# Patient Record
Sex: Female | Born: 1980 | Race: Black or African American | Hispanic: No | Marital: Married | Smoking: Never smoker
Health system: Southern US, Community
[De-identification: ages and names within clinical notes are randomized; demographics above are authoritative.]

## PROBLEM LIST (undated history)

## (undated) DIAGNOSIS — N979 Female infertility, unspecified: Secondary | ICD-10-CM

## (undated) DIAGNOSIS — D219 Benign neoplasm of connective and other soft tissue, unspecified: Secondary | ICD-10-CM

## (undated) DIAGNOSIS — Z5189 Encounter for other specified aftercare: Secondary | ICD-10-CM

## (undated) DIAGNOSIS — N809 Endometriosis, unspecified: Secondary | ICD-10-CM

## (undated) DIAGNOSIS — N800 Endometriosis of uterus: Secondary | ICD-10-CM

## (undated) DIAGNOSIS — N83209 Unspecified ovarian cyst, unspecified side: Secondary | ICD-10-CM

## (undated) DIAGNOSIS — Z90710 Acquired absence of both cervix and uterus: Secondary | ICD-10-CM

## (undated) DIAGNOSIS — N8003 Adenomyosis of the uterus: Secondary | ICD-10-CM

## (undated) HISTORY — DX: Unspecified ovarian cyst, unspecified side: N83.209

## (undated) HISTORY — PX: DILATION AND CURETTAGE OF UTERUS: SHX78

## (undated) HISTORY — DX: Endometriosis of uterus: N80.0

## (undated) HISTORY — DX: Acquired absence of both cervix and uterus: Z90.710

## (undated) HISTORY — DX: Adenomyosis of the uterus: N80.03

## (undated) HISTORY — DX: Encounter for other specified aftercare: Z51.89

## (undated) HISTORY — DX: Endometriosis, unspecified: N80.9

## (undated) HISTORY — DX: Female infertility, unspecified: N97.9

## (undated) HISTORY — PX: ABDOMINAL HYSTERECTOMY: SHX81

## (undated) HISTORY — DX: Benign neoplasm of connective and other soft tissue, unspecified: D21.9

---

## 2017-11-12 ENCOUNTER — Emergency Department (HOSPITAL_COMMUNITY)
Admission: EM | Admit: 2017-11-12 | Discharge: 2017-11-12 | Disposition: A | Discharge status: Home / Self Care | Payer: Self-pay | Attending: Emergency Medicine | Admitting: Emergency Medicine

## 2017-11-12 ENCOUNTER — Encounter (HOSPITAL_COMMUNITY): Payer: Self-pay | Admitting: Emergency Medicine

## 2017-11-12 ENCOUNTER — Other Ambulatory Visit: Payer: Self-pay

## 2017-11-12 DIAGNOSIS — M545 Low back pain, unspecified: Secondary | ICD-10-CM

## 2017-11-12 MED ORDER — NAPROXEN 500 MG PO TABS: 500.0000 mg | ORAL_TABLET | Freq: Two times a day (BID) | ORAL | 0 refills | Status: DC

## 2017-11-12 MED ORDER — CYCLOBENZAPRINE HCL 5 MG PO TABS: 5.0000 mg | ORAL_TABLET | Freq: Every evening | ORAL | 0 refills | Status: DC | PRN

## 2017-11-12 NOTE — Discharge Instructions (Signed)
Take naproxen 2 times a day with meals.  Do not take other anti-inflammatories at the same time open (Advil, Motrin, ibuprofen, Aleve). You may supplement with Tylenol if you need further pain control. Use heating pads to help with pain.  Use flexeril as needed at night for pain.  Try not to stay in a seated position for more than 20-30 minutes.  Follow-up with orthopedic doctor if your pain is not improving in 1 week. Return to the emergency room if you develop numbness, loss of bowel or bladder control, weakness, or any new or concerning symptoms.

## 2017-11-12 NOTE — ED Triage Notes (Signed)
C/o lower back pain x 2 weeks.  No known injury.  Denies pain, numbness, or weakness in legs.  Pt ambulatory to triage.

## 2017-11-12 NOTE — ED Provider Notes (Signed)
[facility] EMERGENCY DEPARTMENT Provider Note   CSN: [id number] Arrival date & time: 11/12/17  1820     History   Chief Complaint Chief Complaint  Patient presents with  . Back Pain    HPI Michelle Holloway is a 37 y.o. female presenting for evaluation of low back pain.  Patient states for the past 3 weeks, she has had low back pain.  This began gradually, and has gradually worsened.  She has tried massage, stretching, and time, but nothing has made her symptoms better.  She reports intermittent history of low back pain that has not been evaluated by a doctor in the past.  She denies fall, trauma, or injury.  She denies radiation of the pain down her legs.  No red flags of back pain including fever, trauma, numbness, tingling, loss of bowel or bladder control, history of cancer, or history of IV drug use.  She has not tried any Tylenol or ibuprofen.  Movement makes the pain worse, nothing makes it better.  She denies pain elsewhere.  She has no other medical problems, does not take medications daily.  HPI  History reviewed. No pertinent past medical history.  There are no active problems to display for this patient.   History reviewed. No pertinent surgical history.   OB History   None      Home Medications    Prior to Admission medications   Medication Sig Start Date End Date Taking? Authorizing Provider  cyclobenzaprine (FLEXERIL) 5 MG tablet Take 1 tablet (5 mg total) by mouth at bedtime as needed for muscle spasms. 11/12/17   Asalee Barrette, PA-C  naproxen (NAPROSYN) 500 MG tablet Take 1 tablet (500 mg total) by mouth 2 (two) times daily with a meal. 11/12/17   Cord Wilczynski, PA-C    Family History No family history on file.  Social History Social History   Tobacco Use  . Smoking status: Never Smoker  . Smokeless tobacco: Never Used  Substance Use Topics  . Alcohol use: Never    Frequency: Never  . Drug use: Never     Allergies     Patient has no allergy information on record.   Review of Systems Review of Systems  Constitutional: Negative for chills and fever.  Musculoskeletal: Positive for back pain.  Neurological: Negative for numbness.     Physical Exam Updated Vital Signs BP 128/70 (BP Location: Right Arm)   Pulse 80   Temp 98.6 F (37 C) (Oral)   Resp 16   Ht 5\' 7"  (1.702 m)   Wt 110.7 kg (244 lb)   LMP 08/30/2017 (Approximate)   SpO2 100%   BMI 38.22 kg/m   Physical Exam  Constitutional: She is oriented to person, place, and time. She appears well-developed and well-nourished. No distress.  HENT:  Head: Normocephalic and atraumatic.  Eyes: Pupils are equal, round, and reactive to light. Conjunctivae and EOM are normal.  Neck: Normal range of motion. Neck supple.  Cardiovascular: Normal rate, regular rhythm and intact distal pulses.  Pulmonary/Chest: Effort normal and breath sounds normal. No respiratory distress. She has no wheezes.  Abdominal: Soft. She exhibits no distension and no mass. There is no tenderness. There is no guarding.  Musculoskeletal: She exhibits tenderness.  Tenderness to palpation of lumbar and sacral back bilaterally.  No increased pain/focal pain over midline spine.  No obvious deformity.  Strength of lower extreme knees intact bilaterally.  Pedal pulses equal bilaterally.  Sensation intact bilaterally.  No saddle  paresthesias.  No tenderness palpation elsewhere on the back.  Neurological: She is alert and oriented to person, place, and time. She displays normal reflexes. No sensory deficit.  Skin: Skin is warm and dry.  Psychiatric: She has a normal mood and affect.  Nursing note and vitals reviewed.    ED Treatments / Results  Labs (all labs ordered are listed, but only abnormal results are displayed) Labs Reviewed - No data to display  EKG None  Radiology No results found.  Procedures Procedures (including critical care time)  Medications Ordered in  ED Medications - No data to display   Initial Impression / Assessment and Plan / ED Course  I have reviewed the triage vital signs and the nursing notes.  Pertinent labs & imaging results that were available during my care of the patient were reviewed by me and considered in my medical decision making (see chart for details).     Presenting for evaluation of low back pain.  Physical exam reassuring, no obvious neurologic deficits. No red flags for back pain. Likely MSK related pain, possible bulging disc.  However, at this time I do not believe further imaging is necessary.  Will treat with NSAIDs and muscle relaxers.  Discussed use of heat and positioning.  Follow-up with orthopedics as needed.  At this time, doubt vertebral injury, infection, spinal cord compression, myelopathy, or cauda equina syndrome.  Discussed findings with patient.  At this time, patient appears safe for discharge.  Return precautions given.  Patient states she understands and agrees to plan.   Final Clinical Impressions(s) / ED Diagnoses   Final diagnoses:  Acute bilateral low back pain without sciatica    ED Discharge Orders        Ordered    naproxen (NAPROSYN) 500 MG tablet  2 times daily with meals     11/12/17 2047    cyclobenzaprine (FLEXERIL) 5 MG tablet  At bedtime PRN     11/12/17 2047       Franchot Heidelberg, PA-C 11/12/17 2123    Fredia Sorrow, MD 11/16/17 [id number]

## 2017-11-12 NOTE — ED Notes (Signed)
Pt verbalizes understanding of d/c instructions. Pt received prescriptions. Pt ambulatory at d/c with all belongings.  

## 2019-05-23 HISTORY — PX: ABDOMINAL HYSTERECTOMY: SHX81

## 2019-09-10 ENCOUNTER — Other Ambulatory Visit: Payer: Self-pay

## 2020-02-27 ENCOUNTER — Encounter: Payer: Self-pay | Admitting: Nurse Practitioner

## 2020-02-27 ENCOUNTER — Other Ambulatory Visit: Payer: Self-pay

## 2020-02-27 ENCOUNTER — Ambulatory Visit (INDEPENDENT_AMBULATORY_CARE_PROVIDER_SITE_OTHER): Payer: 59 | Admitting: Nurse Practitioner

## 2020-02-27 VITALS — BP 118/64 | HR 75 | Temp 97.6°F | Resp 18 | Ht 66.5 in | Wt 257.2 lb

## 2020-02-27 DIAGNOSIS — Z8639 Personal history of other endocrine, nutritional and metabolic disease: Secondary | ICD-10-CM

## 2020-02-27 DIAGNOSIS — Z13220 Encounter for screening for lipoid disorders: Secondary | ICD-10-CM

## 2020-02-27 DIAGNOSIS — Z1159 Encounter for screening for other viral diseases: Secondary | ICD-10-CM

## 2020-02-27 DIAGNOSIS — Z0000 Encounter for general adult medical examination without abnormal findings: Secondary | ICD-10-CM

## 2020-02-27 DIAGNOSIS — Z1231 Encounter for screening mammogram for malignant neoplasm of breast: Secondary | ICD-10-CM | POA: Diagnosis not present

## 2020-02-27 DIAGNOSIS — I83813 Varicose veins of bilateral lower extremities with pain: Secondary | ICD-10-CM

## 2020-02-27 DIAGNOSIS — Z0001 Encounter for general adult medical examination with abnormal findings: Secondary | ICD-10-CM | POA: Diagnosis not present

## 2020-02-27 DIAGNOSIS — Z124 Encounter for screening for malignant neoplasm of cervix: Secondary | ICD-10-CM | POA: Diagnosis not present

## 2020-02-27 DIAGNOSIS — Z114 Encounter for screening for human immunodeficiency virus [HIV]: Secondary | ICD-10-CM

## 2020-02-27 DIAGNOSIS — Z7689 Persons encountering health services in other specified circumstances: Secondary | ICD-10-CM

## 2020-02-27 DIAGNOSIS — Z13228 Encounter for screening for other metabolic disorders: Secondary | ICD-10-CM

## 2020-02-27 NOTE — Patient Instructions (Addendum)
Follow a low fat, cholesterol, carbohydrate, sugar diet rich in fiber and 2 gram per day restricted sodium.  Get at least 20 minutes per day of exercise at least 4 times per week  Drink plenty of water  Labs today will call with results and direction if indicated  Blood pressure goal 140/90 or less  Vascular referral for bilateral lower extremity vascular symptoms

## 2020-02-27 NOTE — Progress Notes (Signed)
New Patient Office Visit  Subjective:  Patient ID: Michelle Holloway, female    DOB: 1981/08/11  Age: 39 y.o. MRN: [id number]  CC:  Chief Complaint  Patient presents with  . Establish Care    NP    HPI Michelle Holloway  Is a 39 year old female presenting for general adult exam and to establish care. She moves from [place] 3 years ago to [facility]. She had a complete hysterectomy in Oct 2020 r/t dysmenorrhea and has been doing well since. She has concerns with painful varicose veins in her legs that she would like a referral. She also would like to complete her mammogram and referral to GYN for annual .She would like to complete labs today to include Vit D with def h/o with no replacement and sxs of fatigue and occasional constipation.   Treatment plan and directions as types discussed and pt v/l understanding and desires.   Past Medical History:  Diagnosis Date  . H/O total hysterectomy     Past Surgical History:  Procedure Laterality Date  . ABDOMINAL HYSTERECTOMY      Family History  Problem Relation Age of Onset  . Diabetes Mother   . Hypertension Mother   . Hyperlipidemia Mother   . Rheum arthritis Mother   . Heart murmur Father   . Diabetes Father   . Hypertension Father   . Hyperlipidemia Father   . Diabetes Sister   . Hypertension Sister   . Rheum arthritis Sister   . Diabetes Brother   . Hypertension Brother   . Ovarian cancer Paternal Grandmother     Social History   Socioeconomic History  . Marital status: Married    Spouse name: Not on file  . Number of children: Not on file  . Years of education: Not on file  . Highest education level: Not on file  Occupational History  . Not on file  Tobacco Use  . Smoking status: Never Smoker  . Smokeless tobacco: Never Used  Vaping Use  . Vaping Use: Never used  Substance and Sexual Activity  . Alcohol use: Never  . Drug use: Never  . Sexual activity: Yes    Birth control/protection: Surgical  Other Topics Concern   . Not on file  Social History Narrative  . Not on file   Social Determinants of Health   Financial Resource Strain:   . Difficulty of Paying Living Expenses:   Food Insecurity:   . Worried About Charity fundraiser in the Last Year:   . Arboriculturist in the Last Year:   Transportation Needs:   . Film/video editor (Medical):   Marland Kitchen Lack of Transportation (Non-Medical):   Physical Activity:   . Days of Exercise per Week:   . Minutes of Exercise per Session:   Stress:   . Feeling of Stress :   Social Connections:   . Frequency of Communication with Friends and Family:   . Frequency of Social Gatherings with Friends and Family:   . Attends Religious Services:   . Active Member of Clubs or Organizations:   . Attends Archivist Meetings:   Marland Kitchen Marital Status:   Intimate Partner Violence:   . Fear of Current or Ex-Partner:   . Emotionally Abused:   Marland Kitchen Physically Abused:   . Sexually Abused:     ROS Review of Systems  All other systems reviewed and are negative.   Objective:   Today's Vitals: BP 118/64 (BP Location: Left  Arm, Patient Position: Sitting, Cuff Size: Normal)   Pulse 75   Temp 97.6 F (36.4 C) (Temporal)   Resp 18   Ht 5' 6.5" (1.689 m)   Wt 257 lb 3.2 oz (116.7 kg)   LMP 08/30/2017 (Approximate)   SpO2 97%   BMI 40.89 kg/m   Physical Exam Vitals and nursing note reviewed.  Constitutional:      General: She is not in acute distress.    Appearance: Normal appearance. She is well-developed and well-groomed. She is not ill-appearing, toxic-appearing or diaphoretic.  HENT:     Head: Normocephalic.     Jaw: There is normal jaw occlusion.     Right Ear: Hearing, ear canal and external ear normal.     Left Ear: Hearing, ear canal and external ear normal.     Nose: Nose normal. No nasal deformity, septal deviation, mucosal edema, congestion or rhinorrhea.     Mouth/Throat:     Lips: Pink.     Mouth: Mucous membranes are moist.     Dentition:  Normal dentition. No dental tenderness.     Tongue: No lesions. Tongue does not deviate from midline.     Pharynx: Oropharynx is clear. Uvula midline. No oropharyngeal exudate or posterior oropharyngeal erythema.  Eyes:     General: Lids are normal. Lids are everted, no foreign bodies appreciated. No scleral icterus.    Extraocular Movements: Extraocular movements intact.     Conjunctiva/sclera: Conjunctivae normal.     Pupils: Pupils are equal, round, and reactive to light.  Neck:     Thyroid: No thyromegaly or thyroid tenderness.     Vascular: No carotid bruit or JVD.  Cardiovascular:     Rate and Rhythm: Normal rate and regular rhythm.     Pulses: Normal pulses.     Heart sounds: Normal heart sounds, S1 normal and S2 normal.  Pulmonary:     Effort: Pulmonary effort is normal. No accessory muscle usage.     Breath sounds: Normal breath sounds.  Abdominal:     General: Abdomen is flat. Bowel sounds are normal. There is no distension or abdominal bruit.     Palpations: Abdomen is soft. There is no hepatomegaly or splenomegaly.     Tenderness: There is no abdominal tenderness. There is no right CVA tenderness, left CVA tenderness, guarding or rebound. Negative signs include Murphy's sign and McBurney's sign.  Musculoskeletal:        General: No swelling. Normal range of motion.     Right shoulder: Normal.     Left shoulder: Normal.     Cervical back: Normal, full passive range of motion without pain, normal range of motion and neck supple.     Thoracic back: Normal.     Lumbar back: Normal.     Right lower leg: No edema.     Left lower leg: No edema.  Lymphadenopathy:     Head:     Right side of head: No submental, submandibular or tonsillar adenopathy.     Left side of head: No submental, submandibular or tonsillar adenopathy.     Cervical: No cervical adenopathy.  Skin:    General: Skin is warm and dry.     Capillary Refill: Capillary refill takes less than 2 seconds.      Coloration: Skin is not jaundiced.     Findings: No bruising, erythema or rash.  Neurological:     General: No focal deficit present.     Mental Status: She is alert  and oriented to person, place, and time.     GCS: GCS eye subscore is 4. GCS verbal subscore is 5. GCS motor subscore is 6.     Gait: Gait normal.  Psychiatric:        Attention and Perception: Attention and perception normal.        Mood and Affect: Mood and affect normal.        Speech: Speech normal.        Behavior: Behavior normal. Behavior is cooperative.        Thought Content: Thought content normal.        Cognition and Memory: Cognition and memory normal.        Judgment: Judgment normal.     Assessment & Plan:   Problem List Items Addressed This Visit    None    Visit Diagnoses    Adult general medical exam    -  Primary   Relevant Orders   Lipid panel   CBC with Differential/Platelet   COMPLETE METABOLIC PANEL WITH GFR   Encounter to establish care       Relevant Orders   Lipid panel   CBC with Differential/Platelet   COMPLETE METABOLIC PANEL WITH GFR   Breast cancer screening by mammogram       Relevant Orders   MM Digital Screening   Encounter for screening for cervical cancer        Relevant Orders   Ambulatory referral to Gynecology   Screening, lipid       Relevant Orders   Lipid panel   Screening for metabolic disorder       Relevant Orders   CBC with Differential/Platelet   COMPLETE METABOLIC PANEL WITH GFR   H/O vitamin D deficiency       Relevant Orders   VITAMIN D 25 Hydroxy (Vit-D Deficiency, Fractures)   Need for hepatitis C screening test       Relevant Orders   Hepatitis C antibody   Encounter for screening for HIV       Relevant Orders   HIV Antibody (routine testing w rflx)   Varicose veins of both lower extremities with pain       Relevant Orders   AMB Referral to Peripheral Vascular Navigator    Follow a low fat, cholesterol, carbohydrate, sugar diet rich in fiber  and 2 gram per day restricted sodium.  Get at least 20 minutes per day of exercise at least 4 times per week  Drink plenty of water  Labs today will call with results and direction if indicated  Blood pressure goal 140/90 or less  Vascular referral for bilateral lower extremity vascular symptoms  Outpatient Encounter Medications as of 02/27/2020  Medication Sig  . [DISCONTINUED] naproxen (NAPROSYN) 500 MG tablet Take 1 tablet (500 mg total) by mouth 2 (two) times daily with a meal.  . [DISCONTINUED] cyclobenzaprine (FLEXERIL) 5 MG tablet Take 1 tablet (5 mg total) by mouth at bedtime as needed for muscle spasms.   No facility-administered encounter medications on file as of 02/27/2020.    Follow-up: Return in about 1 year (around 02/26/2021).   Annie Main, FNP

## 2020-02-28 ENCOUNTER — Other Ambulatory Visit (HOSPITAL_COMMUNITY): Payer: Self-pay

## 2020-03-02 ENCOUNTER — Telehealth (HOSPITAL_COMMUNITY): Payer: Self-pay

## 2020-03-02 ENCOUNTER — Other Ambulatory Visit: Payer: Self-pay | Admitting: Nurse Practitioner

## 2020-03-02 DIAGNOSIS — R7309 Other abnormal glucose: Secondary | ICD-10-CM

## 2020-03-02 DIAGNOSIS — E785 Hyperlipidemia, unspecified: Secondary | ICD-10-CM

## 2020-03-02 DIAGNOSIS — Z8639 Personal history of other endocrine, nutritional and metabolic disease: Secondary | ICD-10-CM

## 2020-03-02 MED ORDER — VITAMIN D-3 25 MCG (1000 UT) PO CAPS: 1.0000 | ORAL_CAPSULE | ORAL | 11 refills | Status: DC

## 2020-03-02 MED ORDER — ROSUVASTATIN CALCIUM 5 MG PO TABS: 5.0000 mg | ORAL_TABLET | Freq: Every day | ORAL | 3 refills | Status: DC

## 2020-03-02 MED ORDER — VITAMIN D3 1.25 MG (50000 UT) PO CAPS: 1.0000 | ORAL_CAPSULE | ORAL | 0 refills | Status: DC

## 2020-03-02 NOTE — Progress Notes (Signed)
Established care, no medication , no medical history  Labs show elevated triglycerides, elevated LDL. Vitamin D is low, Glucose is high, Hep C and HIV non reactive/negative.  Add A1C to lab please!  I will go ahead and add Rosuvastatin and vitamin D. The pt will need to have labs in 3 months for Lipids, A1C, urine microalbuminuria, Vitamin D.   Pt should follow a low fat/cholesterol, sugar/carbohydrate diet, get at least 20 minute of exercise at least 4 times per week. Once I have the A1C we will let her know status.

## 2020-03-02 NOTE — Telephone Encounter (Signed)
PV Navigator Consult acknowledged and chart reviewed. I was able to speak with patient via telephone to introduce myself and my role.   Patient recently moved to the [place] area and is getting established with a primary care physician ([facility]). Consult received for Varicose veins BLE with pain ([phone])  Per patient, she has noticed an increase in varicose veins BLE with left more so than right leg. She describes pain in left lower leg with ambulation however denies pain in legs at rest unless she "pushes" on her legs. Denies any wounds. States she does have swelling in both feet and ankles that will go down when she elevates extremities. She says she would like a vascular consult to help her determine what can be done to limit pain and swelling.   Patient denies history of DM, CAD, Renal disease. Never smoked.  I will reach out to [facility] for referral and appointment to determine screening needs.   Patient denies other barriers/questions at this time. Contact information given to patient should needs arise.   Thank you, Cletis Media RN BSN [facility] [place]

## 2020-03-03 ENCOUNTER — Other Ambulatory Visit: Payer: Self-pay

## 2020-03-03 ENCOUNTER — Ambulatory Visit
Admission: RE | Admit: 2020-03-03 | Discharge: 2020-03-03 | Disposition: A | Discharge status: Home / Self Care | Payer: 59 | Source: Ambulatory Visit | Attending: Nurse Practitioner | Admitting: Nurse Practitioner

## 2020-03-03 DIAGNOSIS — Z1231 Encounter for screening mammogram for malignant neoplasm of breast: Secondary | ICD-10-CM

## 2020-03-04 LAB — LIPID PANEL
Cholesterol: 212 mg/dL — ABNORMAL HIGH (ref <200)
HDL: 64 mg/dL (ref >=50)
LDL Cholesterol (Calc): 121 mg/dL (calc) — ABNORMAL HIGH
Non-HDL Cholesterol (Calc): 148 mg/dL (calc) — ABNORMAL HIGH (ref <130)
Total CHOL/HDL Ratio: 3.3 (calc) (ref <5.0)
Triglycerides: 159 mg/dL — ABNORMAL HIGH (ref <150)

## 2020-03-04 LAB — CBC WITH DIFFERENTIAL/PLATELET
Absolute Monocytes: 510 cells/uL (ref 200–950)
Basophils Absolute: 30 cells/uL (ref 0–200)
Basophils Relative: 0.5 %
Eosinophils Absolute: 342 cells/uL (ref 15–500)
Eosinophils Relative: 5.7 %
HCT: 45.3 % — ABNORMAL HIGH (ref 35.0–45.0)
Hemoglobin: 14.5 g/dL (ref 11.7–15.5)
Lymphs Abs: 3570 cells/uL (ref 850–3900)
MCH: 27.5 pg (ref 27.0–33.0)
MCHC: 32 g/dL (ref 32.0–36.0)
MCV: 86 fL (ref 80.0–100.0)
MPV: 9.8 fL (ref 7.5–12.5)
Monocytes Relative: 8.5 %
Neutro Abs: 1548 cells/uL (ref 1500–7800)
Neutrophils Relative %: 25.8 %
Platelets: 246 10*3/uL (ref 140–400)
RBC: 5.27 10*6/uL — ABNORMAL HIGH (ref 3.80–5.10)
RDW: 14.1 % (ref 11.0–15.0)
Total Lymphocyte: 59.5 %
WBC: 6 10*3/uL (ref 3.8–10.8)

## 2020-03-04 LAB — COMPLETE METABOLIC PANEL WITH GFR
AG Ratio: 1.3 (calc) (ref 1.0–2.5)
ALT: 19 U/L (ref 6–29)
AST: 17 U/L (ref 10–30)
Albumin: 4.1 g/dL (ref 3.6–5.1)
Alkaline phosphatase (APISO): 83 U/L (ref 31–125)
BUN: 10 mg/dL (ref 7–25)
CO2: 25 mmol/L (ref 20–32)
Calcium: 9.4 mg/dL (ref 8.6–10.2)
Chloride: 102 mmol/L (ref 98–110)
Creat: 0.88 mg/dL (ref 0.50–1.10)
GFR, Est African American: 95 mL/min/{1.73_m2} (ref >=60)
GFR, Est Non African American: 82 mL/min/{1.73_m2} (ref >=60)
Globulin: 3.1 g/dL (calc) (ref 1.9–3.7)
Glucose, Bld: 141 mg/dL — ABNORMAL HIGH (ref 65–99)
Potassium: 4.5 mmol/L (ref 3.5–5.3)
Sodium: 137 mmol/L (ref 135–146)
Total Bilirubin: 0.4 mg/dL (ref 0.2–1.2)
Total Protein: 7.2 g/dL (ref 6.1–8.1)

## 2020-03-04 LAB — HIV ANTIBODY (ROUTINE TESTING W REFLEX): HIV 1&2 Ab, 4th Generation: NONREACTIVE

## 2020-03-04 LAB — HEMOGLOBIN A1C W/OUT EAG: Hgb A1c MFr Bld: 6.1 % of total Hgb — ABNORMAL HIGH (ref <5.7)

## 2020-03-04 LAB — TEST AUTHORIZATION

## 2020-03-04 LAB — VITAMIN D 25 HYDROXY (VIT D DEFICIENCY, FRACTURES): Vit D, 25-Hydroxy: 13 ng/mL — ABNORMAL LOW (ref 30–100)

## 2020-03-04 LAB — HEPATITIS C ANTIBODY
Hepatitis C Ab: NONREACTIVE
SIGNAL TO CUT-OFF: 0.07 (ref <1.00)

## 2020-04-07 ENCOUNTER — Ambulatory Visit: Payer: 59 | Admitting: Cardiovascular Disease

## 2020-04-22 ENCOUNTER — Encounter: Payer: Self-pay | Admitting: General Practice

## 2020-04-24 ENCOUNTER — Other Ambulatory Visit: Payer: Self-pay

## 2020-04-24 DIAGNOSIS — I8390 Asymptomatic varicose veins of unspecified lower extremity: Secondary | ICD-10-CM

## 2020-05-07 ENCOUNTER — Ambulatory Visit (INDEPENDENT_AMBULATORY_CARE_PROVIDER_SITE_OTHER): Payer: 59 | Admitting: Physician Assistant

## 2020-05-07 ENCOUNTER — Other Ambulatory Visit: Payer: Self-pay

## 2020-05-07 ENCOUNTER — Ambulatory Visit (HOSPITAL_COMMUNITY)
Admission: RE | Admit: 2020-05-07 | Discharge: 2020-05-07 | Disposition: A | Discharge status: Home / Self Care | Payer: 59 | Source: Ambulatory Visit | Attending: Vascular Surgery | Admitting: Vascular Surgery

## 2020-05-07 VITALS — BP 133/91 | HR 79 | Resp 16 | Ht 67.0 in | Wt 243.0 lb

## 2020-05-07 DIAGNOSIS — I8390 Asymptomatic varicose veins of unspecified lower extremity: Secondary | ICD-10-CM | POA: Diagnosis present

## 2020-05-07 DIAGNOSIS — I8393 Asymptomatic varicose veins of bilateral lower extremities: Secondary | ICD-10-CM

## 2020-05-07 DIAGNOSIS — M7989 Other specified soft tissue disorders: Secondary | ICD-10-CM

## 2020-05-07 NOTE — Progress Notes (Signed)
[facility]           OF [place]  History and Physical   Michelle Holloway is a 39 y.o. female who presents with leg swelling.  She states that she has had some leg swelling for a while and it was not really bothersome until recently.  She states that she has a knot on the anterior portion of her thigh just above her knee that has been present for about 3 years.  She states the size of it hasn't really changed, but recently, it has become tender to touch.  There is no redness present.  She states that if she sits for long periods of time or has her knees bent, she starts to have some numbness in her legs and in the crease behind her knee.  She states she also has this happen with her arms bilaterally.  She does not have hx of DVT.  She has tried compression in the past and this did help with her swelling.  She does not have family hx of varicose veins.  She states that she recently became vegan and has lost 15lbs.  She has done well with this and enjoyed trying new vegan restaurants downtown.    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   The pt is on a statin for cholesterol management.  The pt is not on a daily aspirin.   Other AC:  none The pt is not on medications for hypertension.   The pt is not diabetic.   Tobacco hx:  never  There is no family hx of AAA.  Past Medical History:  Diagnosis Date  . H/O total hysterectomy     Past Surgical History:  Procedure Laterality Date  . ABDOMINAL HYSTERECTOMY      Social History   Socioeconomic History  . Marital status: Married    Spouse name: Not on file  . Number of children: Not on file  . Years of education: Not on file  . Highest education level: Not on file  Occupational History  . Not on file  Tobacco Use  . Smoking status: Never Smoker  . Smokeless tobacco: Never Used  Vaping Use  . Vaping Use: Never  used  Substance and Sexual Activity  . Alcohol use: Never  . Drug use: Never  . Sexual activity: Yes    Birth control/protection: Surgical  Other Topics Concern  . Not on file  Social History Narrative  . Not on file   Social Determinants of Health   Financial Resource Strain:   . Difficulty of Paying Living Expenses: Not on file  Food Insecurity:   . Worried About Charity fundraiser in the Last Year: Not on file  . Ran Out of Food in the Last Year: Not on file  Transportation Needs:   . Lack of Transportation (Medical): Not on file  . Lack of Transportation (Non-Medical): Not on file  Physical Activity:   . Days of Exercise per Week: Not on file  . Minutes of Exercise per Session: Not on file  Stress:   . Feeling of Stress : Not on file  Social Connections:   . Frequency of Communication with Friends and Family: Not on file  . Frequency of Social Gatherings with Friends and Family: Not on file  . Attends Religious Services: Not on file  .  Active Member of Clubs or Organizations: Not on file  . Attends Archivist Meetings: Not on file  . Marital Status: Not on file  Intimate Partner Violence:   . Fear of Current or Ex-Partner: Not on file  . Emotionally Abused: Not on file  . Physically Abused: Not on file  . Sexually Abused: Not on file     Family History  Problem Relation Age of Onset  . Diabetes Mother   . Hypertension Mother   . Hyperlipidemia Mother   . Rheum arthritis Mother   . Heart murmur Father   . Diabetes Father   . Hypertension Father   . Hyperlipidemia Father   . Diabetes Sister   . Hypertension Sister   . Rheum arthritis Sister   . Diabetes Brother   . Hypertension Brother   . Ovarian cancer Paternal Grandmother     Current Outpatient Medications  Medication Sig Dispense Refill  . Cholecalciferol (VITAMIN D-3) 25 MCG (1000 UT) CAPS Take 1 capsule (1,000 Units total) by mouth 1 day or 1 dose for 360 doses. TO BE STARTED ONCE  COMPLETED WEEKLY DOSE OF VITAMIN D 3 50,000 UNITS FOR 8 WEEKS. 30 capsule 11  . Cholecalciferol (VITAMIN D3) 1.25 MG (50000 UT) CAPS Take 1 capsule by mouth once a week. ONCE COMPLETED START TAKING THE 1,000 MG DAILY DOSE 8 capsule 0  . rosuvastatin (CRESTOR) 5 MG tablet Take 1 tablet (5 mg total) by mouth daily. 90 tablet 3   No current facility-administered medications for this visit.    No Known Allergies  REVIEW OF SYSTEMS:   [X]  denotes positive finding, [ ]  denotes negative finding Cardiac  Comments:  Chest pain or chest pressure:    Shortness of breath upon exertion:    Short of breath when lying flat:    Irregular heart rhythm:        Vascular    Pain in calf, thigh, or hip brought on by ambulation:    Pain in feet at night that wakes you up from your sleep:     Blood clot in your veins:    Leg swelling:  x See HPI      Pulmonary    Oxygen at home:    Productive cough:     Wheezing:         Neurologic    Sudden weakness in arms or legs:     Sudden numbness in arms or legs:  x See HPI  Sudden onset of difficulty speaking or slurred speech:    Temporary loss of vision in one eye:  x See HPI  Problems with dizziness:         Gastrointestinal    Blood in stool:     Vomited blood:         Genitourinary    Burning when urinating:     Blood in urine:        Psychiatric    Major depression:         Hematologic    Bleeding problems:    Problems with blood clotting too easily:        Skin    Rashes or ulcers:        Constitutional    Fever or chills:      PHYSICAL EXAMINATION:  Today's Vitals   05/07/20 1518  BP: (!) 133/91  Pulse: 79  Resp: 16  SpO2: 97%  Weight: 243 lb (110.2 kg)  Height: 5\' 7"  (1.702 m)  PainSc:  5    Body mass index is 38.06 kg/m.   General:  WDWN in NAD; vital signs documented above Gait: Normal HENT: WNL, normocephalic Pulmonary: normal non-labored breathing without wheezing Cardiac: regular HR; without carotid  bruits Abdomen: soft, NT, no masses; aortic pulse is not palpable Skin: without rashes Vascular Exam/Pulses:  Right Left  Radial 2+ (normal) 2+ (normal)  DP 2+ (normal) 2+ (normal)  PT Unable to palpate Unable to palpate   Extremities: without ischemic changes, without cellulitis; without open wounds; without skin color changes; there is a tiny palpable mass smaller than a BB in the left distal anterior thigh and just medial to midline.  There is no redness present.  Musculoskeletal: no muscle wasting or atrophy  Neurologic: A&O X 3;  moving all extremities equally Psychiatric:  The pt has Normal affect.   Non-Invasive Vascular Imaging:   Venous duplex on 05/07/2020: +--------------+---------+------+-----------+------------+--------+  LEFT     Reflux NoRefluxReflux TimeDiameter cmsComments               Yes                   +--------------+---------+------+-----------+------------+--------+  CFV            yes  >1 second             +--------------+---------+------+-----------+------------+--------+  FV mid    no                         +--------------+---------+------+-----------+------------+--------+  Popliteal   no                         +--------------+---------+------+-----------+------------+--------+  GSV at SFJ        yes  >500 ms   0.68        +--------------+---------+------+-----------+------------+--------+  GSV prox thigh      yes  >500 ms   0.48        +--------------+---------+------+-----------+------------+--------+  GSV mid thigh       yes  >500 ms   0.4         +--------------+---------+------+-----------+------------+--------+  GSV dist thighno               0.27        +--------------+---------+------+-----------+------------+--------+   GSV at knee        yes  >500 ms   0.24        +--------------+---------+------+-----------+------------+--------+  GSV prox calf no               0.3         +--------------+---------+------+-----------+------------+--------+  SSV Pop Fossa no               0.13        +--------------+---------+------+-----------+------------+--------+  SSV prox calf no               0.17        +--------------+---------+------+-----------+------------+--------+  SSV mid calf no               0.11        +--------------+---------+------+-----------+------------+--------+   Left distal thigh Medioanterior- palpable knot, heterogenous area  measuring 0.4 cm with no vascularity.    Summary:  Left:  - No evidence of deep vein thrombosis seen in the left lower extremity,  from the common femoral through the popliteal veins.  - No evidence of superficial venous thrombosis in the left lower  extremity.  - Venous reflux is noted  in the left common femoral vein.  - Venous reflux is noted in the left sapheno-femoral junction.  - Venous reflux is noted in the left greater saphenous vein in the thigh.    Michelle Holloway is a 39 y.o. female who presents with: bilateral leg swelling with left worse than right  The left leg has had numbness and swelling.  She does have evidence of deep venous reflux as well as superficial venous reflux on the left leg.  The GSV diameter is not adequate for laser ablation.   -discussed with pt about wearing thigh high 20-59mmHg compression stockings and elevating legs to help with swelling.  Hand out was given. Discussed water exercises as well. -discussed importance of weight loss.  She recently started vegan diet and has lost 15lbs.  She has done well with this.   -she does have a tiny nodule on the distal left thigh that has been present for about 3  years and the size has not changed and has become more tender in the recent past.  On u/s, there was no vascularity to this.  Advised pt to continue to monitor and if it enlarges or becomes more bothersome, to discuss with her PCP for referral to possibly dermatology or surgery for possible removal.   Also advised her to try warm compresses to see if she gets any relief with this. There is no redness or evidence of infection present. -pt will f/u as needed.    Leontine Locket, [facility] 05/07/2020 2:45 PM  Clinic MD:  Carlis Abbott on call MD

## 2020-07-23 ENCOUNTER — Ambulatory Visit: Payer: 59 | Admitting: Obstetrics and Gynecology

## 2020-08-31 ENCOUNTER — Other Ambulatory Visit: Payer: Self-pay

## 2020-08-31 ENCOUNTER — Ambulatory Visit (INDEPENDENT_AMBULATORY_CARE_PROVIDER_SITE_OTHER): Payer: 59 | Admitting: Nurse Practitioner

## 2020-08-31 ENCOUNTER — Encounter: Payer: Self-pay | Admitting: Nurse Practitioner

## 2020-08-31 VITALS — BP 120/82 | HR 89 | Temp 97.7°F | Ht 67.0 in | Wt 238.0 lb

## 2020-08-31 DIAGNOSIS — Z13220 Encounter for screening for lipoid disorders: Secondary | ICD-10-CM

## 2020-08-31 DIAGNOSIS — Z13228 Encounter for screening for other metabolic disorders: Secondary | ICD-10-CM | POA: Diagnosis not present

## 2020-08-31 DIAGNOSIS — E785 Hyperlipidemia, unspecified: Secondary | ICD-10-CM

## 2020-08-31 DIAGNOSIS — R1032 Left lower quadrant pain: Secondary | ICD-10-CM | POA: Insufficient documentation

## 2020-08-31 DIAGNOSIS — Z1329 Encounter for screening for other suspected endocrine disorder: Secondary | ICD-10-CM

## 2020-08-31 DIAGNOSIS — R7309 Other abnormal glucose: Secondary | ICD-10-CM | POA: Diagnosis not present

## 2020-08-31 DIAGNOSIS — M79675 Pain in left toe(s): Secondary | ICD-10-CM

## 2020-08-31 DIAGNOSIS — N644 Mastodynia: Secondary | ICD-10-CM | POA: Insufficient documentation

## 2020-08-31 HISTORY — DX: Left lower quadrant pain: R10.32

## 2020-08-31 HISTORY — DX: Pain in left toe(s): M79.675

## 2020-08-31 NOTE — Assessment & Plan Note (Signed)
Likely due to ingrown toenail.  Discussed foot care.  No s/s infection today.  May consider removal in future if pain remains an issue.
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
## 2020-08-31 NOTE — Assessment & Plan Note (Signed)
Acute, improving.  Examination today normal.  Recommend ice/Tylenol for pain.  Bruise appears to be resolving.  Will recheck in ~2 weeks at physical.  Encouraged to repeat mammogram as scheduled.

## 2020-08-31 NOTE — Assessment & Plan Note (Addendum)
Acute, ongoing.  Unclear etiology, no red flags.  Unable to obtain urine testing or labs today due to staffing in laboratory; labs ordered for future visit.  Suspect kidney stone; will check urine at upcoming physical appointment.  Encouraged to keep upcoming appointment in March with OB/GYN for pap smear.

## 2020-08-31 NOTE — Patient Instructions (Signed)
Ingrown Toenail An ingrown toenail occurs when the corner or sides of a toenail grow into the surrounding skin. This causes discomfort and pain. The big toe is most commonly affected, but any of the toes can be affected. If an ingrown toenail is nottreated, it can become infected. What are the causes? This condition may be caused by: Wearing shoes that are too small or tight. An injury, such as stubbing your toe or having your toe stepped on. Improper cutting or care of your toenails. Having nail or foot abnormalities that were present from birth (congenital abnormalities), such as having a nail that is too big for your toe. What increases the risk? The following factors may make you more likely to develop ingrown toenails: Age. Nails tend to get thicker with age, so ingrown nails are more common among older people. Cutting your toenails incorrectly, such as cutting them very short or cutting them unevenly. An ingrown toenail is more likely to get infected if you have: Diabetes. Blood flow (circulation) problems. What are the signs or symptoms? Symptoms of an ingrown toenail may include: Pain, soreness, or tenderness. Redness. Swelling. Hardening of the skin that surrounds the toenail. Signs that an ingrown toenail may be infected include: Fluid or pus. Symptoms that get worse instead of better. How is this diagnosed? An ingrown toenail may be diagnosed based on your medical history, your symptoms, and a physical exam. If you have fluid or blood coming from your toenail, a sample may be collected to test for the specific type of bacteriathat is causing the infection. How is this treated? Treatment depends on how severe your ingrown toenail is. You may be able to care for your toenail at home. If you have an infection, you may be prescribed antibiotic medicines. If you have fluid or pus draining from your toenail, your health care provider may drain it. If you have trouble walking, you  may be given crutches to use. If you have a severe or infected ingrown toenail, you may need a procedure to remove part or all of the nail. Follow these instructions at home: Foot care  Do not pick at your toenail or try to remove it yourself. Soak your foot in warm, soapy water. Do this for 20 minutes, 3 times a day, or as often as told by your health care provider. This helps to keep your toe clean and keep your skin soft. Wear shoes that fit well and are not too tight. Your health care provider may recommend that you wear open-toed shoes while you heal. Trim your toenails regularly and carefully. Cut your toenails straight across to prevent injury to the skin at the corners of the toenail. Do not cut your nails in a curved shape. Keep your feet clean and dry to help prevent infection.  Medicines Take over-the-counter and prescription medicines only as told by your health care provider. If you were prescribed an antibiotic, take it as told by your health care provider. Do not stop taking the antibiotic even if you start to feel better. Activity Return to your normal activities as told by your health care provider. Ask your health care provider what activities are safe for you. Avoid activities that cause pain. General instructions If your health care provider told you to use crutches to help you move around, use them as instructed. Keep all follow-up visits as told by your health care provider. This is important. Contact a health care provider if: You have more redness, swelling, pain, or   other symptoms that do not improve with treatment. You have fluid, blood, or pus coming from your toenail. Get help right away if: You have a red streak on your skin that starts at your foot and spreads up your leg. You have a fever. Summary An ingrown toenail occurs when the corner or sides of a toenail grow into the surrounding skin. This causes discomfort and pain. The big toe is most commonly  affected, but any of the toes can be affected. If an ingrown toenail is not treated, it can become infected. Fluid or pus draining from your toenail is a sign of infection. Your health care provider may need to drain it. You may be given antibiotics to treat the infection. Trimming your toenails regularly and properly can help you prevent an ingrown toenail. This information is not intended to replace advice given to you by your health care provider. Make sure you discuss any questions you have with your healthcare provider. Document Revised: 11/30/2018 Document Reviewed: 04/26/2017 Elsevier Patient Education  2021 [employer]  

## 2020-09-09 ENCOUNTER — Other Ambulatory Visit: Payer: Self-pay

## 2020-09-09 ENCOUNTER — Other Ambulatory Visit: Payer: 59

## 2020-09-09 DIAGNOSIS — R7309 Other abnormal glucose: Secondary | ICD-10-CM

## 2020-09-09 DIAGNOSIS — Z13228 Encounter for screening for other metabolic disorders: Secondary | ICD-10-CM

## 2020-09-09 DIAGNOSIS — Z1329 Encounter for screening for other suspected endocrine disorder: Secondary | ICD-10-CM

## 2020-09-09 DIAGNOSIS — Z13220 Encounter for screening for lipoid disorders: Secondary | ICD-10-CM

## 2020-09-09 DIAGNOSIS — R1032 Left lower quadrant pain: Secondary | ICD-10-CM

## 2020-09-09 LAB — URINALYSIS, ROUTINE W REFLEX MICROSCOPIC
Bilirubin Urine: NEGATIVE
Glucose, UA: NEGATIVE
Hgb urine dipstick: NEGATIVE
Ketones, ur: NEGATIVE
Leukocytes,Ua: NEGATIVE
Nitrite: NEGATIVE
Protein, ur: NEGATIVE
Specific Gravity, Urine: 1.028 (ref 1.001–1.03)
pH: 6 (ref 5.0–8.0)

## 2020-09-12 LAB — TSH: TSH: 3.47 mIU/L

## 2020-09-12 LAB — LIPID PANEL
Cholesterol: 181 mg/dL (ref <200)
HDL: 67 mg/dL (ref >=50)
LDL Cholesterol (Calc): 95 mg/dL (calc)
Non-HDL Cholesterol (Calc): 114 mg/dL (calc) (ref <130)
Total CHOL/HDL Ratio: 2.7 (calc) (ref <5.0)
Triglycerides: 92 mg/dL (ref <150)

## 2020-09-12 LAB — HEMOGLOBIN A1C
Hgb A1c MFr Bld: 5.9 % of total Hgb — ABNORMAL HIGH (ref <5.7)
Mean Plasma Glucose: 123 mg/dL
eAG (mmol/L): 6.8 mmol/L

## 2020-09-12 LAB — COMPLETE METABOLIC PANEL WITH GFR
AG Ratio: 1.5 (calc) (ref 1.0–2.5)
ALT: 13 U/L (ref 6–29)
AST: 14 U/L (ref 10–30)
Albumin: 3.8 g/dL (ref 3.6–5.1)
Alkaline phosphatase (APISO): 76 U/L (ref 31–125)
BUN: 12 mg/dL (ref 7–25)
CO2: 24 mmol/L (ref 20–32)
Calcium: 9.1 mg/dL (ref 8.6–10.2)
Chloride: 106 mmol/L (ref 98–110)
Creat: 0.87 mg/dL (ref 0.50–1.10)
GFR, Est African American: 96 mL/min/{1.73_m2} (ref >=60)
GFR, Est Non African American: 83 mL/min/{1.73_m2} (ref >=60)
Globulin: 2.6 g/dL (calc) (ref 1.9–3.7)
Glucose, Bld: 115 mg/dL — ABNORMAL HIGH (ref 65–99)
Potassium: 4.2 mmol/L (ref 3.5–5.3)
Sodium: 140 mmol/L (ref 135–146)
Total Bilirubin: 0.3 mg/dL (ref 0.2–1.2)
Total Protein: 6.4 g/dL (ref 6.1–8.1)

## 2020-09-12 LAB — TEST AUTHORIZATION

## 2020-09-12 LAB — URIC ACID: Uric Acid, Serum: 6.3 mg/dL (ref 2.5–7.0)

## 2020-09-12 LAB — VITAMIN D 25 HYDROXY (VIT D DEFICIENCY, FRACTURES): Vit D, 25-Hydroxy: 25 ng/mL — ABNORMAL LOW (ref 30–100)

## 2020-09-16 ENCOUNTER — Ambulatory Visit (INDEPENDENT_AMBULATORY_CARE_PROVIDER_SITE_OTHER): Payer: 59 | Admitting: Nurse Practitioner

## 2020-09-16 ENCOUNTER — Encounter: Payer: Self-pay | Admitting: Nurse Practitioner

## 2020-09-16 ENCOUNTER — Other Ambulatory Visit: Payer: Self-pay

## 2020-09-16 VITALS — BP 122/74 | HR 76 | Temp 97.8°F | Resp 16 | Ht 67.0 in | Wt 239.0 lb

## 2020-09-16 DIAGNOSIS — N644 Mastodynia: Secondary | ICD-10-CM

## 2020-09-16 DIAGNOSIS — M79675 Pain in left toe(s): Secondary | ICD-10-CM | POA: Diagnosis not present

## 2020-09-16 DIAGNOSIS — Z13220 Encounter for screening for lipoid disorders: Secondary | ICD-10-CM

## 2020-09-16 NOTE — Progress Notes (Signed)
Subjective:    Patient ID: Michelle Holloway, female    DOB: 1980/10/23, 40 y.o.   MRN: [id number]  HPI: Michelle Holloway is a 40 y.o. female presenting for follow up for hyperlipidemia and vitamin D deficiency.  Chief Complaint  Patient presents with  . Follow-up   Has started follow a Vegan diet in August and has lost a lot of weight, cholesterol levels have stabilized and reports she overall feels much better.  Was previously prescribed rosuvastatin and never started taking it due to wanting to try lifestyle changes first.   HYPERLIPIDEMIA Hyperlipidemia status: improved with diet Satisfied with current treatment?  yes Side effects:  not currently on medication Medication compliance: n/a Past cholesterol meds: none Aspirin:  no The 10-year ASCVD risk score Michelle Holloway) is: 0.3%   Values used to calculate the score:     Age: 15 years     Sex: Female     Is Non-Hispanic African American: Yes     Diabetic: No     Tobacco smoker: No     Systolic Blood Pressure: 123XX123 mmHg     Is BP treated: No     HDL Cholesterol: 67 mg/dL     Total Cholesterol: 181 mg/dL Chest pain:  no Coronary artery disease:  no Family history CAD:  no Family history early CAD:  no  VITAMIN D DEFICIENCY Vitamin D level was decreased in the past, has been taking Vitamin D3 but inconsistently.  Has been trying to take it more faithfully.  Report toenail pain is improving, has been trying to keep barrier between toenail and skin. Is worried that the white portion of her toenail is much shorter than the other side.  No Known Allergies  Outpatient Encounter Medications as of 09/16/2020  Medication Sig  . Cyanocobalamin (B-12 PO) Take by mouth daily.  . Cholecalciferol (VITAMIN D-3) 25 MCG (1000 UT) CAPS Take 1 capsule (1,000 Units total) by mouth 1 day or 1 dose for 360 doses. TO BE STARTED ONCE COMPLETED WEEKLY DOSE OF VITAMIN D 3 50,000 UNITS FOR 8 WEEKS. (Patient not taking: Reported on  09/16/2020)  . [DISCONTINUED] Cholecalciferol (VITAMIN D3) 1.25 MG (50000 UT) CAPS Take 1 capsule by mouth once a week. ONCE COMPLETED START TAKING THE 1,000 MG DAILY DOSE (Patient not taking: Reported on 09/16/2020)  . [DISCONTINUED] rosuvastatin (CRESTOR) 5 MG tablet Take 1 tablet (5 mg total) by mouth daily. (Patient not taking: Reported on 09/16/2020)   No facility-administered encounter medications on file as of 09/16/2020.    Patient Active Problem List   Diagnosis Date Noted  . Pain of toe of left foot 08/31/2020  . Breast pain 08/31/2020  . LLQ pain 08/31/2020    Past Medical History:  Diagnosis Date  . H/O total hysterectomy     Relevant past medical, surgical, family and social history reviewed and updated as indicated. Interim medical history since our last visit reviewed.  Review of Systems  Respiratory: Negative.   Cardiovascular: Negative.   Musculoskeletal: Negative.        + toenail pain  Neurological: Negative.   Hematological: Negative.   Psychiatric/Behavioral: Negative.     Per HPI unless specifically indicated above     Objective:    BP 122/74   Pulse 76   Temp 97.8 F (36.6 C) (Temporal)   Resp 16   Ht 5\' 7"  (1.702 m)   Wt 239 lb (108.4 kg)   LMP 08/30/2017 (Approximate)  SpO2 98%   BMI 37.43 kg/m   Wt Readings from Last 3 Encounters:  09/16/20 239 lb (108.4 kg)  08/31/20 238 lb (108 kg)  05/07/20 243 lb (110.2 kg)    Physical Exam Vitals and nursing note reviewed.  Constitutional:      General: She is not in acute distress.    Appearance: Normal appearance. She is not toxic-appearing.  Cardiovascular:     Rate and Rhythm: Normal rate and regular rhythm.     Heart sounds: Normal heart sounds.  Pulmonary:     Effort: Pulmonary effort is normal. No respiratory distress.     Breath sounds: Normal breath sounds. No wheezing, rhonchi or rales.  Feet:     Right foot:     Toenail Condition: Right toenails are normal.     Left foot:      Toenail Condition: Left toenails are ingrown.  Neurological:     Mental Status: She is alert and oriented to person, place, and time.     Motor: No weakness.     Gait: Gait normal.  Psychiatric:        Mood and Affect: Mood normal.        Behavior: Behavior normal.        Thought Content: Thought content normal.        Judgment: Judgment normal.     Results for orders placed or performed in visit on 09/09/20  TSH  Result Value Ref Range   TSH 3.47 mIU/L  Lipid panel  Result Value Ref Range   Cholesterol 181 <200 mg/dL   HDL 67 > OR = 50 mg/dL   Triglycerides 92 <150 mg/dL   LDL Cholesterol (Calc) 95 mg/dL (calc)   Total CHOL/HDL Ratio 2.7 <5.0 (calc)   Non-HDL Cholesterol (Calc) 114 <130 mg/dL (calc)  Hemoglobin A1c  Result Value Ref Range   Hgb A1c MFr Bld 5.9 (H) <5.7 % of total Hgb   Mean Plasma Glucose 123 mg/dL   eAG (mmol/L) 6.8 mmol/L  COMPLETE METABOLIC PANEL WITH GFR  Result Value Ref Range   Glucose, Bld 115 (H) 65 - 99 mg/dL   BUN 12 7 - 25 mg/dL   Creat 0.87 0.50 - 1.10 mg/dL   GFR, Est Non African American 83 > OR = 60 mL/min/1.46m2   GFR, Est African American 96 > OR = 60 mL/min/1.88m2   BUN/Creatinine Ratio NOT APPLICABLE 6 - 22 (calc)   Sodium 140 135 - 146 mmol/L   Potassium 4.2 3.5 - 5.3 mmol/L   Chloride 106 98 - 110 mmol/L   CO2 24 20 - 32 mmol/L   Calcium 9.1 8.6 - 10.2 mg/dL   Total Protein 6.4 6.1 - 8.1 g/dL   Albumin 3.8 3.6 - 5.1 g/dL   Globulin 2.6 1.9 - 3.7 g/dL (calc)   AG Ratio 1.5 1.0 - 2.5 (calc)   Total Bilirubin 0.3 0.2 - 1.2 mg/dL   Alkaline phosphatase (APISO) 76 31 - 125 U/L   AST 14 10 - 30 U/L   ALT 13 6 - 29 U/L  Uric acid  Result Value Ref Range   Uric Acid, Serum 6.3 2.5 - 7.0 mg/dL  Urinalysis, Routine w reflex microscopic  Result Value Ref Range   Color, Urine YELLOW YELLOW   APPearance CLEAR CLEAR   Specific Gravity, Urine 1.028 1.001 - 1.03   pH 6.0 5.0 - 8.0   Glucose, UA NEGATIVE NEGATIVE   Bilirubin Urine  NEGATIVE NEGATIVE   Ketones, ur  NEGATIVE NEGATIVE   Hgb urine dipstick NEGATIVE NEGATIVE   Protein, ur NEGATIVE NEGATIVE   Nitrite NEGATIVE NEGATIVE   Leukocytes,Ua NEGATIVE NEGATIVE  VITAMIN D 25 Hydroxy (Vit-D Deficiency, Fractures)  Result Value Ref Range   Vit D, 25-Hydroxy 25 (L) 30 - 100 ng/mL  TEST AUTHORIZATION  Result Value Ref Range   TEST NAME: VITAMIN D,25-OH,TOTAL,IA    TEST CODE: [id number]    CLIENT CONTACT: Learta Codding    REPORT ALWAYS MESSAGE SIGNATURE        Assessment & Plan:  1. Breast pain Resolved.  Will obtain screening mammogram later this year.  2. Pain of toe of left foot Acute, ongoing and improving.  No s/s paronychia today or fungal infection.  Discussed options including removing portion of toenail.  Patient declined for now and elects to proceed with conservative care.  To return to clinic if toenail pain returns or persists.  3. Screening, lipid Cholesterol levels have normalized with diet.  Discussed The 10-year ASCVD risk score Michelle Bussing DC Brooke Bonito., et al., Holloway) is: 0.3%   Values used to calculate the score:     Age: 6 years     Sex: Female     Is Non-Hispanic African American: Yes     Diabetic: No     Tobacco smoker: No     Systolic Blood Pressure: 675 mmHg     Is BP treated: No     HDL Cholesterol: 67 mg/dL     Total Cholesterol: 181 mg/dL. No statin indicated at this time.  Encouraged patient to participate in regular exercise for primary prevention.     Follow up plan: Return in about 6 months (around 03/16/2021) for follow up.

## 2020-09-16 NOTE — Patient Instructions (Signed)
Ingrown Toenail An ingrown toenail occurs when the corner or sides of a toenail grow into the surrounding skin. This causes discomfort and pain. The big toe is most commonly affected, but any of the toes can be affected. If an ingrown toenail is nottreated, it can become infected. What are the causes? This condition may be caused by: Wearing shoes that are too small or tight. An injury, such as stubbing your toe or having your toe stepped on. Improper cutting or care of your toenails. Having nail or foot abnormalities that were present from birth (congenital abnormalities), such as having a nail that is too big for your toe. What increases the risk? The following factors may make you more likely to develop ingrown toenails: Age. Nails tend to get thicker with age, so ingrown nails are more common among older people. Cutting your toenails incorrectly, such as cutting them very short or cutting them unevenly. An ingrown toenail is more likely to get infected if you have: Diabetes. Blood flow (circulation) problems. What are the signs or symptoms? Symptoms of an ingrown toenail may include: Pain, soreness, or tenderness. Redness. Swelling. Hardening of the skin that surrounds the toenail. Signs that an ingrown toenail may be infected include: Fluid or pus. Symptoms that get worse instead of better. How is this diagnosed? An ingrown toenail may be diagnosed based on your medical history, your symptoms, and a physical exam. If you have fluid or blood coming from your toenail, a sample may be collected to test for the specific type of bacteriathat is causing the infection. How is this treated? Treatment depends on how severe your ingrown toenail is. You may be able to care for your toenail at home. If you have an infection, you may be prescribed antibiotic medicines. If you have fluid or pus draining from your toenail, your health care provider may drain it. If you have trouble walking, you  may be given crutches to use. If you have a severe or infected ingrown toenail, you may need a procedure to remove part or all of the nail. Follow these instructions at home: Foot care  Do not pick at your toenail or try to remove it yourself. Soak your foot in warm, soapy water. Do this for 20 minutes, 3 times a day, or as often as told by your health care provider. This helps to keep your toe clean and keep your skin soft. Wear shoes that fit well and are not too tight. Your health care provider may recommend that you wear open-toed shoes while you heal. Trim your toenails regularly and carefully. Cut your toenails straight across to prevent injury to the skin at the corners of the toenail. Do not cut your nails in a curved shape. Keep your feet clean and dry to help prevent infection.  Medicines Take over-the-counter and prescription medicines only as told by your health care provider. If you were prescribed an antibiotic, take it as told by your health care provider. Do not stop taking the antibiotic even if you start to feel better. Activity Return to your normal activities as told by your health care provider. Ask your health care provider what activities are safe for you. Avoid activities that cause pain. General instructions If your health care provider told you to use crutches to help you move around, use them as instructed. Keep all follow-up visits as told by your health care provider. This is important. Contact a health care provider if: You have more redness, swelling, pain, or   other symptoms that do not improve with treatment. You have fluid, blood, or pus coming from your toenail. Get help right away if: You have a red streak on your skin that starts at your foot and spreads up your leg. You have a fever. Summary An ingrown toenail occurs when the corner or sides of a toenail grow into the surrounding skin. This causes discomfort and pain. The big toe is most commonly  affected, but any of the toes can be affected. If an ingrown toenail is not treated, it can become infected. Fluid or pus draining from your toenail is a sign of infection. Your health care provider may need to drain it. You may be given antibiotics to treat the infection. Trimming your toenails regularly and properly can help you prevent an ingrown toenail. This information is not intended to replace advice given to you by your health care provider. Make sure you discuss any questions you have with your healthcare provider. Document Revised: 11/30/2018 Document Reviewed: 04/26/2017 Elsevier Patient Education  2021 [employer]  

## 2020-10-21 ENCOUNTER — Ambulatory Visit: Payer: 59 | Admitting: Obstetrics and Gynecology

## 2020-10-23 ENCOUNTER — Ambulatory Visit (INDEPENDENT_AMBULATORY_CARE_PROVIDER_SITE_OTHER): Payer: 59 | Admitting: Nurse Practitioner

## 2020-10-23 ENCOUNTER — Other Ambulatory Visit: Payer: Self-pay

## 2020-10-23 ENCOUNTER — Encounter: Payer: Self-pay | Admitting: Nurse Practitioner

## 2020-10-23 VITALS — BP 108/70 | HR 70 | Resp 16 | Ht 66.75 in | Wt 241.0 lb

## 2020-10-23 DIAGNOSIS — Z01419 Encounter for gynecological examination (general) (routine) without abnormal findings: Secondary | ICD-10-CM

## 2020-10-23 DIAGNOSIS — N941 Unspecified dyspareunia: Secondary | ICD-10-CM | POA: Diagnosis not present

## 2020-10-23 NOTE — Patient Instructions (Signed)
Health Maintenance, Female Adopting a healthy lifestyle and getting preventive care are important in promoting health and wellness. Ask your health care provider about:  The right schedule for you to have regular tests and exams.  Things you can do on your own to prevent diseases and keep yourself healthy. What should I know about diet, weight, and exercise? Eat a healthy diet  Eat a diet that includes plenty of vegetables, fruits, low-fat dairy products, and lean protein.  Do not eat a lot of foods that are high in solid fats, added sugars, or sodium.   Maintain a healthy weight Body mass index (BMI) is used to identify weight problems. It estimates body fat based on height and weight. Your health care provider can help determine your BMI and help you achieve or maintain a healthy weight. Get regular exercise Get regular exercise. This is one of the most important things you can do for your health. Most adults should:  Exercise for at least 150 minutes each week. The exercise should increase your heart rate and make you sweat (moderate-intensity exercise).  Do strengthening exercises at least twice a week. This is in addition to the moderate-intensity exercise.  Spend less time sitting. Even light physical activity can be beneficial. Watch cholesterol and blood lipids Have your blood tested for lipids and cholesterol at 42 years of age, then have this test every 5 years. Have your cholesterol levels checked more often if:  Your lipid or cholesterol levels are high.  You are older than 42 years of age.  You are at high risk for heart disease. What should I know about cancer screening? Depending on your health history and family history, you may need to have cancer screening at various ages. This may include screening for:  Breast cancer.  Cervical cancer.  Colorectal cancer.  Skin cancer.  Lung cancer. What should I know about heart disease, diabetes, and high blood  pressure? Blood pressure and heart disease  High blood pressure causes heart disease and increases the risk of stroke. This is more likely to develop in people who have high blood pressure readings, are of African descent, or are overweight.  Have your blood pressure checked: ? Every 3-5 years if you are 18-39 years of age. ? Every year if you are 40 years old or older. Diabetes Have regular diabetes screenings. This checks your fasting blood sugar level. Have the screening done:  Once every three years after age 40 if you are at a normal weight and have a low risk for diabetes.  More often and at a younger age if you are overweight or have a high risk for diabetes. What should I know about preventing infection? Hepatitis B If you have a higher risk for hepatitis B, you should be screened for this virus. Talk with your health care provider to find out if you are at risk for hepatitis B infection. Hepatitis C Testing is recommended for:  Everyone born from 1945 through 1965.  Anyone with known risk factors for hepatitis C. Sexually transmitted infections (STIs)  Get screened for STIs, including gonorrhea and chlamydia, if: ? You are sexually active and are younger than 42 years of age. ? You are older than 42 years of age and your health care provider tells you that you are at risk for this type of infection. ? Your sexual activity has changed since you were last screened, and you are at increased risk for chlamydia or gonorrhea. Ask your health care provider   if you are at risk.  Ask your health care provider about whether you are at high risk for HIV. Your health care provider may recommend a prescription medicine to help prevent HIV infection. If you choose to take medicine to prevent HIV, you should first get tested for HIV. You should then be tested every 3 months for as long as you are taking the medicine. Pregnancy  If you are about to stop having your period (premenopausal) and  you may become pregnant, seek counseling before you get pregnant.  Take 400 to 800 micrograms (mcg) of folic acid every day if you become pregnant.  Ask for birth control (contraception) if you want to prevent pregnancy. Osteoporosis and menopause Osteoporosis is a disease in which the bones lose minerals and strength with aging. This can result in bone fractures. If you are 65 years old or older, or if you are at risk for osteoporosis and fractures, ask your health care provider if you should:  Be screened for bone loss.  Take a calcium or vitamin D supplement to lower your risk of fractures.  Be given hormone replacement therapy (HRT) to treat symptoms of menopause. Follow these instructions at home: Lifestyle  Do not use any products that contain nicotine or tobacco, such as cigarettes, e-cigarettes, and chewing tobacco. If you need help quitting, ask your health care provider.  Do not use street drugs.  Do not share needles.  Ask your health care provider for help if you need support or information about quitting drugs. Alcohol use  Do not drink alcohol if: ? Your health care provider tells you not to drink. ? You are pregnant, may be pregnant, or are planning to become pregnant.  If you drink alcohol: ? Limit how much you use to 0-1 drink a day. ? Limit intake if you are breastfeeding.  Be aware of how much alcohol is in your drink. In the [place] one drink equals one 12 oz bottle of beer (355 mL), one 5 oz glass of wine (148 mL), or one 1 oz glass of hard liquor (44 mL). General instructions  Schedule regular health, dental, and eye exams.  Stay current with your vaccines.  Tell your health care provider if: ? You often feel depressed. ? You have ever been abused or do not feel safe at home. Summary  Adopting a healthy lifestyle and getting preventive care are important in promoting health and wellness.  Follow your health care provider's instructions about healthy  diet, exercising, and getting tested or screened for diseases.  Follow your health care provider's instructions on monitoring your cholesterol and blood pressure. This information is not intended to replace advice given to you by your health care provider. Make sure you discuss any questions you have with your health care provider. Document Revised: 08/01/2018 Document Reviewed: 08/01/2018 Elsevier Patient Education  2021 Elsevier Inc.  

## 2020-10-23 NOTE — Progress Notes (Addendum)
40 y.o. G18P0050 Married Michelle Holloway here for annual exam.      New to the practice, gyn physician left the area. Hx of Hyst for adenomyosis and associated anemia. Was never able to bear children despite IVF. Was a difficult time for her emotionally, still is storing eggs.  Still has both ovaries, has endometriosis, but does not have cyclical pain since surgery.  Reports pain with intercourse, left side, "feels like he is hitting something" and is sore afterwards. Does not happen all the time. It started before surgery but is persistent.   Denies problems with bowel or bladder  She is married and her husband has 2 children from previous relationship. 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 She is vegan and feels better since eating this way.  Patient's last menstrual period was 08/30/2017 (approximate).          Sexually active: Yes.    The current method of family planning is status post hysterectomy.    Exercising: Yes.    eliptical & walking Smoker:  no  Health Maintenance: Pap:  2020 neg History of abnormal Pap:  no MMG:  03-03-2020 category b density birads 1:neg Colonoscopy:  none BMD:   none TDaP:  unsure Gardasil:   none Covid-19: pfizer Hep C testing: neg 2021 Screening Labs: has done with PCP, states was done in January and was normal   reports that she has never smoked. She has never used smokeless tobacco. She reports previous alcohol use. She reports that she does not use drugs.  Past Medical History:  Diagnosis Date  . Adenomyosis   . Blood transfusion without reported diagnosis   . Endometriosis   . Fibroid   . H/O total hysterectomy   . Infertility, Holloway   . Ovarian cyst     Past Surgical History:  Procedure Laterality Date  . ABDOMINAL HYSTERECTOMY    . DILATION AND CURETTAGE OF UTERUS      Current Outpatient Medications  Medication Sig Dispense Refill  . Cyanocobalamin (B-12 PO) Take by mouth daily.     No current  facility-administered medications for this visit.    Family History  Problem Relation Age of Onset  . Diabetes Mother   . Hypertension Mother   . Rheum arthritis Mother   . Diabetes Father   . Hypertension Father   . Heart murmur Father   . Diabetes Sister   . Hypertension Sister   . Rheum arthritis Sister   . Diabetes Brother   . Hypertension Brother   . Diabetes Paternal Grandmother   . Hypertension Paternal Grandmother   . Brain cancer Maternal Uncle   . Ovarian cancer Maternal Grandmother   . Diabetes Maternal Grandmother   . Hypertension Maternal Grandmother   . Stroke Maternal Grandmother   . Colon cancer Maternal Grandfather   . Diabetes Maternal Grandfather   . Hypertension Maternal Grandfather   . Stroke Maternal Grandfather   . Diabetes Paternal Grandfather   . Hypertension Paternal Grandfather     Review of Systems  Exam:   BP 108/70   Pulse 70   Resp 16   Ht 5' 6.75" (1.695 m)   Wt 241 lb (109.3 kg)   LMP 08/30/2017 (Approximate)   BMI 38.03 kg/m   Height: 5' 6.75" (169.5 cm)  General appearance: alert, cooperative and appears stated age, no acute distress Head: Normocephalic, without obvious abnormality Neck: no adenopathy, thyroid normal to inspection and palpation Lungs: clear to auscultation bilaterally Breasts: No  axillary or supraclavicular adenopathy, Normal to palpation without dominant masses Heart: regular rate and rhythm Abdomen: soft, non-tender; no masses,  no organomegaly Extremities: extremities normal, no edema Skin: No rashes or lesions Lymph nodes: Cervical, supraclavicular, and axillary nodes normal. No abnormal inguinal nodes palpated Neurologic: Grossly normal   Pelvic: External genitalia:  no lesions              Urethra:  normal appearing urethra with no masses, tenderness or lesions              Bartholins and Skenes: normal                 Vagina: normal appearing vagina, appropriate for age, normal appearing discharge,  no lesions              Cervix:absent             Bimanual Exam:   Uterus:  uterus absent              Adnexa: Right adnexa palpates normal, non tender. Left adnexa palpates mildly enlarged and tender                 A:  Well Woman with normal exam  S/P Hyst for adenomyosis  Dyspareunia  Left adnexal tenderness  P:   Pap : may discontinue  Mammogram: will schedule, due 02/2021  Labs: with PCP  Medications: no new  Will schedule pelvic US to evaluate Left adnexa/dyspareunia

## 2020-11-24 ENCOUNTER — Ambulatory Visit: Payer: 59 | Admitting: Obstetrics and Gynecology

## 2020-11-24 ENCOUNTER — Other Ambulatory Visit: Payer: Self-pay

## 2020-11-24 ENCOUNTER — Ambulatory Visit (INDEPENDENT_AMBULATORY_CARE_PROVIDER_SITE_OTHER): Payer: 59

## 2020-11-24 ENCOUNTER — Encounter: Payer: Self-pay | Admitting: Obstetrics and Gynecology

## 2020-11-24 VITALS — BP 122/70 | HR 68 | Ht 67.0 in | Wt 244.0 lb

## 2020-11-24 DIAGNOSIS — N83202 Unspecified ovarian cyst, left side: Secondary | ICD-10-CM | POA: Diagnosis not present

## 2020-11-24 DIAGNOSIS — N941 Unspecified dyspareunia: Secondary | ICD-10-CM | POA: Diagnosis not present

## 2020-11-24 DIAGNOSIS — R102 Pelvic and perineal pain: Secondary | ICD-10-CM | POA: Diagnosis not present

## 2020-11-24 DIAGNOSIS — N9412 Deep dyspareunia: Secondary | ICD-10-CM

## 2020-11-24 DIAGNOSIS — N9489 Other specified conditions associated with female genital organs and menstrual cycle: Secondary | ICD-10-CM | POA: Diagnosis not present

## 2020-11-24 NOTE — Patient Instructions (Signed)

## 2020-11-24 NOTE — Progress Notes (Signed)
GYNECOLOGY  VISIT   HPI: 40 y.o.   Married Black or [place] American Not Hispanic or Latino  female   [phone] with Patient's last menstrual period was 08/30/2017 (approximate).   here for evaluation of deep dyspareunia and ovarian tenderness. H/O TAH/BS in 10/20 for adenomyosis.     She has deep dyspareunia, more on the right. Sometimes the pain is positional, more times than not it hurts to have intercourse. This was an issue prior to the hysterectomy. She also has some intermittent pain in the right abdomen/pelvis without intercourse. The pain occurs every 1-2 months and lasts 2-3 days.   GYNECOLOGIC HISTORY: Patient's last menstrual period was 08/30/2017 (approximate). Contraception: hysterectomy Menopausal hormone therapy: none        OB History    Gravida  5   Para      Term      Preterm      AB  5   Living  0     SAB  5   IAB      Ectopic      Multiple      Live Births                 Patient Active Problem List   Diagnosis Date Noted  . Pain of toe of left foot 08/31/2020  . Breast pain 08/31/2020  . LLQ pain 08/31/2020    Past Medical History:  Diagnosis Date  . Adenomyosis   . Blood transfusion without reported diagnosis   . Endometriosis   . Fibroid   . H/O total hysterectomy   . Infertility, female   . Ovarian cyst     Past Surgical History:  Procedure Laterality Date  . ABDOMINAL HYSTERECTOMY    . DILATION AND CURETTAGE OF UTERUS      Current Outpatient Medications  Medication Sig Dispense Refill  . Cyanocobalamin (B-12 PO) Take by mouth daily.     No current facility-administered medications for this visit.     ALLERGIES: Patient has no known allergies.  Family History  Problem Relation Age of Onset  . Diabetes Mother   . Hypertension Mother   . Rheum arthritis Mother   . Diabetes Father   . Hypertension Father   . Heart murmur Father   . Diabetes Sister   . Hypertension Sister   . Rheum arthritis Sister   . Diabetes  Brother   . Hypertension Brother   . Diabetes Paternal Grandmother   . Hypertension Paternal Grandmother   . Brain cancer Maternal Uncle   . Ovarian cancer Maternal Grandmother   . Diabetes Maternal Grandmother   . Hypertension Maternal Grandmother   . Stroke Maternal Grandmother   . Colon cancer Maternal Grandfather   . Diabetes Maternal Grandfather   . Hypertension Maternal Grandfather   . Stroke Maternal Grandfather   . Diabetes Paternal Grandfather   . Hypertension Paternal Grandfather     Social History   Socioeconomic History  . Marital status: Married    Spouse name: Not on file  . Number of children: Not on file  . Years of education: Not on file  . Highest education level: Not on file  Occupational History  . Not on file  Tobacco Use  . Smoking status: Never Smoker  . Smokeless tobacco: Never Used  Vaping Use  . Vaping Use: Never used  Substance and Sexual Activity  . Alcohol use: Not Currently  . Drug use: Never  . Sexual activity: Yes  Partners: Male    Birth control/protection: Surgical    Comment: hysterectomy  Other Topics Concern  . Not on file  Social History Narrative  . Not on file   Social Determinants of Health   Financial Resource Strain: Not on file  Food Insecurity: Not on file  Transportation Needs: Not on file  Physical Activity: Not on file  Stress: Not on file  Social Connections: Not on file  Intimate Partner Violence: Not on file    ROS  PHYSICAL EXAMINATION:    LMP 08/30/2017 (Approximate)     General appearance: alert, cooperative and appears stated age Abdomen: soft, non-tender; non distended, no masses,  no organomegaly. Some fullness in the abdominal wall in the LUQ (fibrosis from liposuction).   Pelvic: External genitalia:  no lesions              Urethra:  normal appearing urethra with no masses, tenderness or lesions              Cervix: absent              Bimanual Exam:  Uterus:  uterus absent               Adnexa: there is a tender mass just above the vaginal cuff, no adnexal pain or fullness              Pelvic floor: not tender  Pelvic ultrasound  Indications: deep dyspareunia, pelvic pain  Findings:  Uterus absent   No masses noted at the vaginal cuff  Left ovary 3.82 x 3.16 x 2.89 cm  2.39 x 1.86 complex cyst, c/w hemorrhagic CL  Right ovary 3.86 x 2.47 x 2.41 cm  No free fluid  Impression:  Absent uterus and cervix Vaginal cuff normal Right ovary normal Left ovary with 2.4 cm complex hemorrhagic cyst  1. Deep dyspareunia in female This proceeded her hysterectomy. Op note was reviewed, ovaries were noted to be normal. She has a small hemorrhagic cyst on her left ovary, but the pain is more toward the right. On exam she has a tender mass just above the vaginal cuff, suspect stool. No other area's of tenderness. Pelvic floor not tender. Discussed the different possible causes of deep dyspareunia, including: bowel, bladder, ovaries, pelvic floor.   Discussed her controlling rate and depth of penetration  2. Pelvic pain Intermittent, may be cyclic. She will calendar her pain. Discussed possible use of OCP's to see if we can suppress her pain  3. Cyst of left ovary C/W hemorrhagic CL No f/u needed  4. Adnexal mass F/U in 1-2 weeks for a repeat exam, suspect stool at her cuff.   In addition to reviewing the ultrasound images, over 20 minutes was spent discussing possible causes of pain and possible management options.   CC: Karma Ganja, NP

## 2020-12-07 ENCOUNTER — Ambulatory Visit: Payer: Self-pay | Admitting: Obstetrics and Gynecology

## 2020-12-10 ENCOUNTER — Encounter: Payer: Self-pay | Admitting: Obstetrics and Gynecology

## 2020-12-10 ENCOUNTER — Ambulatory Visit (INDEPENDENT_AMBULATORY_CARE_PROVIDER_SITE_OTHER): Payer: 59 | Admitting: Obstetrics and Gynecology

## 2020-12-10 ENCOUNTER — Other Ambulatory Visit: Payer: Self-pay

## 2020-12-10 VITALS — BP 122/82 | HR 88 | Ht 67.0 in | Wt 245.0 lb

## 2020-12-10 DIAGNOSIS — N9412 Deep dyspareunia: Secondary | ICD-10-CM

## 2020-12-10 DIAGNOSIS — Z01419 Encounter for gynecological examination (general) (routine) without abnormal findings: Secondary | ICD-10-CM | POA: Diagnosis not present

## 2020-12-10 NOTE — Progress Notes (Signed)
GYNECOLOGY  VISIT   HPI: 40 y.o.   Married Black or [place] American Not Hispanic or Latino  female   [phone] with Patient's last menstrual period was 08/30/2017 (approximate).   here for 10 day follow up of lower right abdominal pain.    H/O TAH/BS in 10/20. She was seen a few weeks ago with c/o deep dyspareunia.  Ultrasound showed a complex cyst on the left ovary that was c/w a hemorrhagic CL, normal right ovary.   On exam she was noted to have a tender mass just above the vaginal cuff. No adnexal pain or fullness. Pelvic floor not tender.   GYNECOLOGIC HISTORY: Patient's last menstrual period was 08/30/2017 (approximate). Contraception: post hysterectomy  Menopausal hormone therapy: none         OB History    Gravida  5   Para      Term      Preterm      AB  5   Living  0     SAB  5   IAB      Ectopic      Multiple      Live Births                 Patient Active Problem List   Diagnosis Date Noted  . Pain of toe of left foot 08/31/2020  . Breast pain 08/31/2020  . LLQ pain 08/31/2020    Past Medical History:  Diagnosis Date  . Adenomyosis   . Blood transfusion without reported diagnosis   . Endometriosis   . Fibroid   . H/O total hysterectomy   . Infertility, female   . Ovarian cyst     Past Surgical History:  Procedure Laterality Date  . ABDOMINAL HYSTERECTOMY    . DILATION AND CURETTAGE OF UTERUS      Current Outpatient Medications  Medication Sig Dispense Refill  . Cyanocobalamin (B-12 PO) Take by mouth daily.     No current facility-administered medications for this visit.     ALLERGIES: Patient has no known allergies.  Family History  Problem Relation Age of Onset  . Diabetes Mother   . Hypertension Mother   . Rheum arthritis Mother   . Diabetes Father   . Hypertension Father   . Heart murmur Father   . Diabetes Sister   . Hypertension Sister   . Rheum arthritis Sister   . Diabetes Brother   . Hypertension Brother   .  Diabetes Paternal Grandmother   . Hypertension Paternal Grandmother   . Brain cancer Maternal Uncle   . Ovarian cancer Maternal Grandmother   . Diabetes Maternal Grandmother   . Hypertension Maternal Grandmother   . Stroke Maternal Grandmother   . Colon cancer Maternal Grandfather   . Diabetes Maternal Grandfather   . Hypertension Maternal Grandfather   . Stroke Maternal Grandfather   . Diabetes Paternal Grandfather   . Hypertension Paternal Grandfather     Social History   Socioeconomic History  . Marital status: Married    Spouse name: Not on file  . Number of children: Not on file  . Years of education: Not on file  . Highest education level: Not on file  Occupational History  . Not on file  Tobacco Use  . Smoking status: Never Smoker  . Smokeless tobacco: Never Used  Vaping Use  . Vaping Use: Never used  Substance and Sexual Activity  . Alcohol use: Not Currently  . Drug use: Never  .  Sexual activity: Yes    Partners: Male    Birth control/protection: Surgical    Comment: hysterectomy  Other Topics Concern  . Not on file  Social History Narrative  . Not on file   Social Determinants of Health   Financial Resource Strain: Not on file  Food Insecurity: Not on file  Transportation Needs: Not on file  Physical Activity: Not on file  Stress: Not on file  Social Connections: Not on file  Intimate Partner Violence: Not on file    Review of Systems  Gastrointestinal: Negative for abdominal pain.  All other systems reviewed and are negative.   PHYSICAL EXAMINATION:    BP 122/82   Pulse 88   Ht 5\' 7"  (1.702 m)   Wt 245 lb (111.1 kg)   LMP 08/30/2017 (Approximate)   SpO2 99%   BMI 38.37 kg/m     General appearance: alert, cooperative and appears stated age  Pelvic: External genitalia:  no lesions              Cervix: absent              Bimanual Exam:  Uterus:  uterus absent              Adnexa: no masses, prior tenderness at the cuff has improved, no  masses               1. Normal pelvic exam Prior mass and tenderness has resolved  2. Deep dyspareunia in female No concerning findings on recent ultrasound Discussed her controlling rate and depth of penetration Call with any changes.

## 2021-01-28 ENCOUNTER — Other Ambulatory Visit: Payer: Self-pay

## 2021-01-28 ENCOUNTER — Encounter: Payer: Self-pay | Admitting: Nurse Practitioner

## 2021-01-28 ENCOUNTER — Ambulatory Visit (INDEPENDENT_AMBULATORY_CARE_PROVIDER_SITE_OTHER): Payer: 59 | Admitting: Nurse Practitioner

## 2021-01-28 VITALS — BP 110/82 | HR 84 | Temp 98.6°F | Ht 67.0 in | Wt 253.2 lb

## 2021-01-28 DIAGNOSIS — R7303 Prediabetes: Secondary | ICD-10-CM

## 2021-01-28 DIAGNOSIS — Z6839 Body mass index (BMI) 39.0-39.9, adult: Secondary | ICD-10-CM

## 2021-01-28 DIAGNOSIS — R5383 Other fatigue: Secondary | ICD-10-CM

## 2021-01-28 DIAGNOSIS — R238 Other skin changes: Secondary | ICD-10-CM

## 2021-01-28 DIAGNOSIS — M7989 Other specified soft tissue disorders: Secondary | ICD-10-CM

## 2021-01-28 DIAGNOSIS — R233 Spontaneous ecchymoses: Secondary | ICD-10-CM

## 2021-01-28 HISTORY — DX: Prediabetes: R73.03

## 2021-01-28 HISTORY — DX: Other specified soft tissue disorders: M79.89

## 2021-01-28 HISTORY — DX: Morbid (severe) obesity due to excess calories: E66.01

## 2021-01-28 NOTE — Assessment & Plan Note (Signed)
Acute on chronic.  Patient's lower extremities are warm today and distal pulses are palpated easily.  I do not think that the cause of the swelling is decreased circulation, rather may be related to diet, water intake, or dependent edema.  Encouraged wearing compression stockings regularly to prevent swelling, increase water intake.  We will check electrolytes today.  We will also refer to sleep medicine- possible history of snoring, fatigue, insomnia, overweight-good candidate for sleep study.  Follow-up pending results.

## 2021-01-28 NOTE — Assessment & Plan Note (Signed)
Chronic.  We will check hemoglobin A1c today with cholesterol above, metabolic panel.  Referral placed to medical weight management to discuss options for patient including nutrition, medication, possible surgery.  Encouraged 30 minutes 5 times weekly of aerobic activity.  Encouraged water intake 64 ounces daily.  Follow-up pending lab results.

## 2021-01-28 NOTE — Assessment & Plan Note (Signed)
Hemoglobin A1c checked today.  We will follow-up pending blood work.

## 2021-01-28 NOTE — Progress Notes (Signed)
Subjective:    Patient ID: Michelle Holloway, female    DOB: 04-14-1981, 40 y.o.   MRN: [id number]  HPI: Michelle Holloway is a 40 y.o. female presenting for weight loss and swelling in her legs.  Chief Complaint  Patient presents with   Back Pain    Thinks this may be due to fall 8yrs ago   Edema    In the ankles for a yr with random bruising in the legs   Weight Loss    Wants info gastric sleeve, however willing to talk about other methods like the insulin inj   WEIGHT GAIN Mom and best friend have done weight loss surgery with great results.  Patient is interested in this today.  Also reports that a lot of her family members have high cholesterol diabetes and she really wants to avoid this.  Previously, she was eating a vegan diet and lost a good amount of weight, however when she restarted eating a regular diet, she gained all the weight back.  She is wondering what options are available for her today. Duration: chronic  Previous attempts at weight loss: yes Complications of obesity: Prediabetes, borderline high cholesterol, decreased body image Peak weight: 257 lbs Requesting obesity pharmacotherapy: yes Current weight loss supplements/medications: no Previous weight loss supplements/meds: no Water intake: Drinks between 2-5 bottles of water per day Physical activity: Trying to stay physically active-at least 2 days a week on the elliptical.  ANKLE SWELLING Reprots swelling in lower extremities, worse in ankles.  This comes and goes throughout the day.  It is painful at times.  Notices that when she wears compression stockings, this goes away and also when she elevates her legs.  Thinks the circulation might have been affected after she had her hysterectomy and liposuction surgeries.  She also reports easy bruising on and off, especially in her legs.  History of thrombocytopenia reportedly-requesting blood counts to be checked today.  She is not having any pica cravings-does not  eat ice. Duration: chronic Pain: no Severity: not painful  Quality:  feels decreased circulation Location:  both lower legs Bilateral:  equaly Onset: sudden  Frequency: 1-2 days; comes back weekly Time of day: random Sudden unintentional leg jerking: no Paresthesias:   yes, in feet Decreased sensation:  no Weakness:  yes Insomnia:   yes Fatigue:   yes Alleviating factors: elevation , compresion socks Aggravating factors: being up on feet Status: stable Treatments attempted: compression socks, elevation Water intake: ranges from 2-5 bottles daily; swelling is worse when she does not drink as much water.  Cooks at home mostly.  No Known Allergies  Outpatient Encounter Medications as of 01/28/2021  Medication Sig   [DISCONTINUED] Cyanocobalamin (B-12 PO) Take by mouth daily.   No facility-administered encounter medications on file as of 01/28/2021.    Patient Active Problem List   Diagnosis Date Noted   Prediabetes 01/28/2021   Swelling of lower extremity 01/28/2021   BMI 39.0-39.9,adult 01/28/2021   Breast pain 08/31/2020   LLQ pain 08/31/2020    Past Medical History:  Diagnosis Date   Adenomyosis    Blood transfusion without reported diagnosis    Endometriosis    Fibroid    H/O total hysterectomy    Infertility, female    Ovarian cyst    Pain of toe of left foot 08/31/2020    Relevant past medical, surgical, family and social history reviewed and updated as indicated. Interim medical history since our last visit reviewed.  Review of  Systems Per HPI unless specifically indicated above     Objective:    BP 110/82   Pulse 84   Temp 98.6 F (37 C)   Ht 5\' 7"  (1.702 m)   Wt 253 lb 3.2 oz (114.9 kg)   LMP 08/30/2017 (Approximate)   SpO2 98%   BMI 39.66 kg/m   Wt Readings from Last 3 Encounters:  01/28/21 253 lb 3.2 oz (114.9 kg)  12/10/20 245 lb (111.1 kg)  11/24/20 244 lb (110.7 kg)    Physical Exam Vitals and nursing note reviewed.  Constitutional:       General: She is not in acute distress.    Appearance: Normal appearance. She is obese. She is not toxic-appearing.  HENT:     Head: Normocephalic and atraumatic.     Right Ear: External ear normal.     Left Ear: External ear normal.  Eyes:     General: No scleral icterus.    Extraocular Movements: Extraocular movements intact.  Neck:     Vascular: No carotid bruit.     Comments: Acanthosis Cardiovascular:     Rate and Rhythm: Normal rate and regular rhythm.     Heart sounds: Normal heart sounds. No murmur heard. Pulmonary:     Effort: Pulmonary effort is normal. No respiratory distress.     Breath sounds: Normal breath sounds. No wheezing, rhonchi or rales.  Abdominal:     General: Abdomen is flat. Bowel sounds are normal.     Palpations: Abdomen is soft.     Tenderness: There is no abdominal tenderness.  Musculoskeletal:        General: No swelling or tenderness. Normal range of motion.     Cervical back: Normal range of motion.  Skin:    General: Skin is warm and dry.     Capillary Refill: Capillary refill takes less than 2 seconds.     Coloration: Skin is not jaundiced.     Findings: No bruising.  Neurological:     General: No focal deficit present.     Mental Status: She is alert and oriented to person, place, and time.     Motor: No weakness.     Gait: Gait normal.  Psychiatric:        Mood and Affect: Mood normal.        Behavior: Behavior normal.        Thought Content: Thought content normal.        Judgment: Judgment normal.       Assessment & Plan:   Problem List Items Addressed This Visit       Other   Swelling of lower extremity    Acute on chronic.  Patient's lower extremities are warm today and distal pulses are palpated easily.  I do not think that the cause of the swelling is decreased circulation, rather may be related to diet, water intake, or dependent edema.  Encouraged wearing compression stockings regularly to prevent swelling, increase  water intake.  We will check electrolytes today.  We will also refer to sleep medicine- possible history of snoring, fatigue, insomnia, overweight-good candidate for sleep study.  Follow-up pending results.       Prediabetes    Hemoglobin A1c checked today.  We will follow-up pending blood work.       Relevant Orders   Lipid Panel   COMPLETE METABOLIC PANEL WITH GFR   CBC with Differential   Hemoglobin A1c   VITAMIN D 25 Hydroxy (Vit-D Deficiency, Fractures)  BMI 39.0-39.9,adult    Chronic.  We will check hemoglobin A1c today with cholesterol above, metabolic panel.  Referral placed to medical weight management to discuss options for patient including nutrition, medication, possible surgery.  Encouraged 30 minutes 5 times weekly of aerobic activity.  Encouraged water intake 64 ounces daily.  Follow-up pending lab results.       Relevant Orders   Amb Ref to Medical Weight Management   Other Visit Diagnoses     Easy bruising    -  Primary   Relevant Orders   CBC with Differential        Follow up plan: No follow-ups on file.

## 2021-01-29 LAB — CBC WITH DIFFERENTIAL/PLATELET
Absolute Monocytes: 446 cells/uL (ref 200–950)
Basophils Absolute: 29 cells/uL (ref 0–200)
Basophils Relative: 0.6 %
Eosinophils Absolute: 270 cells/uL (ref 15–500)
Eosinophils Relative: 5.5 %
HCT: 45.1 % — ABNORMAL HIGH (ref 35.0–45.0)
Hemoglobin: 14.9 g/dL (ref 11.7–15.5)
Lymphs Abs: 2940 cells/uL (ref 850–3900)
MCH: 28.7 pg (ref 27.0–33.0)
MCHC: 33 g/dL (ref 32.0–36.0)
MCV: 86.9 fL (ref 80.0–100.0)
MPV: 9.9 fL (ref 7.5–12.5)
Monocytes Relative: 9.1 %
Neutro Abs: 1215 cells/uL — ABNORMAL LOW (ref 1500–7800)
Neutrophils Relative %: 24.8 %
Platelets: 259 10*3/uL (ref 140–400)
RBC: 5.19 10*6/uL — ABNORMAL HIGH (ref 3.80–5.10)
RDW: 13.1 % (ref 11.0–15.0)
Total Lymphocyte: 60 %
WBC: 4.9 10*3/uL (ref 3.8–10.8)

## 2021-01-29 LAB — COMPLETE METABOLIC PANEL WITH GFR
AG Ratio: 1.4 (calc) (ref 1.0–2.5)
ALT: 15 U/L (ref 6–29)
AST: 17 U/L (ref 10–30)
Albumin: 4.2 g/dL (ref 3.6–5.1)
Alkaline phosphatase (APISO): 80 U/L (ref 31–125)
BUN: 13 mg/dL (ref 7–25)
CO2: 24 mmol/L (ref 20–32)
Calcium: 9.4 mg/dL (ref 8.6–10.2)
Chloride: 103 mmol/L (ref 98–110)
Creat: 0.85 mg/dL (ref 0.50–1.10)
GFR, Est African American: 99 mL/min/{1.73_m2} (ref >=60)
GFR, Est Non African American: 85 mL/min/{1.73_m2} (ref >=60)
Globulin: 3.1 g/dL (calc) (ref 1.9–3.7)
Glucose, Bld: 121 mg/dL — ABNORMAL HIGH (ref 65–99)
Potassium: 4.8 mmol/L (ref 3.5–5.3)
Sodium: 137 mmol/L (ref 135–146)
Total Bilirubin: 0.7 mg/dL (ref 0.2–1.2)
Total Protein: 7.3 g/dL (ref 6.1–8.1)

## 2021-01-29 LAB — LIPID PANEL
Cholesterol: 226 mg/dL — ABNORMAL HIGH (ref <200)
HDL: 67 mg/dL (ref >=50)
LDL Cholesterol (Calc): 141 mg/dL (calc) — ABNORMAL HIGH
Non-HDL Cholesterol (Calc): 159 mg/dL (calc) — ABNORMAL HIGH (ref <130)
Total CHOL/HDL Ratio: 3.4 (calc) (ref <5.0)
Triglycerides: 85 mg/dL (ref <150)

## 2021-01-29 LAB — HEMOGLOBIN A1C
Hgb A1c MFr Bld: 6.1 % of total Hgb — ABNORMAL HIGH (ref <5.7)
Mean Plasma Glucose: 128 mg/dL
eAG (mmol/L): 7.1 mmol/L

## 2021-01-29 LAB — VITAMIN D 25 HYDROXY (VIT D DEFICIENCY, FRACTURES): Vit D, 25-Hydroxy: 18 ng/mL — ABNORMAL LOW (ref 30–100)

## 2021-02-11 ENCOUNTER — Encounter: Payer: Self-pay | Admitting: Nurse Practitioner

## 2021-02-11 DIAGNOSIS — Z6839 Body mass index (BMI) 39.0-39.9, adult: Secondary | ICD-10-CM

## 2021-03-09 ENCOUNTER — Other Ambulatory Visit: Payer: Self-pay

## 2021-03-09 ENCOUNTER — Encounter: Payer: Self-pay | Admitting: Pulmonary Disease

## 2021-03-09 ENCOUNTER — Ambulatory Visit (INDEPENDENT_AMBULATORY_CARE_PROVIDER_SITE_OTHER): Payer: 59 | Admitting: Pulmonary Disease

## 2021-03-09 VITALS — BP 104/68 | HR 80 | Temp 97.9°F | Ht 66.5 in | Wt 256.8 lb

## 2021-03-09 DIAGNOSIS — F5101 Primary insomnia: Secondary | ICD-10-CM | POA: Diagnosis not present

## 2021-03-09 DIAGNOSIS — R0683 Snoring: Secondary | ICD-10-CM | POA: Diagnosis not present

## 2021-03-09 NOTE — Patient Instructions (Signed)
Will arrange for home sleep study Will call to arrange for follow up after sleep study reviewed  

## 2021-03-09 NOTE — Progress Notes (Signed)
[facility]  Chief Complaint  Patient presents with   Consult    Patient reports that she has trouble falling asleep and gaining some weight, Patient has not had a sleep study.     Constitutional:  BP 104/68 (BP Location: Left Arm, Patient Position: Sitting, Cuff Size: Large)   Pulse 80   Temp 97.9 F (36.6 C) (Oral)   Ht 5' 6.5" (1.689 m)   Wt 256 lb 12.8 oz (116.5 kg)   LMP 08/30/2017 (Approximate)   SpO2 97%   BMI 40.83 kg/m   Past Medical History:  Endometriosis, Ovarian cyst, COVID 19 infection February 2021  Past Surgical History:  She  has a past surgical history that includes Abdominal hysterectomy (N/A, 05/2019) and Dilation and curettage of uterus.  Brief Summary:  Michelle Holloway is a 40 y.o. female with snoring.      Subjective:   Seen by PCP to assess options for weight loss.  Noted to have snoring, fatigue and daytime sleepiness.    Her mother has sleep apnea and was using a CPAP machine.  Her husband says she snores and sometimes has shallow breathing.  She wakes up feeling her teeth clenched sometimes.  She can fall asleep in the evening while watching a movie.  Otherwise she can't sleep during the day.  She tries to go to sleep earlier, but can't fall asleep until around 3 am.  She will often times be thinking about random events prior to sleeping and has difficulty calming her mind.  She wakes up sometimes to use the bathroom  she can't sleep past 9 am.  She isn't using anything to help her stay awake.  She has tried OTC sleep aides with varying degrees of benefit.  She gets anxious sometimes related to usual life stressors, but doesn't feel like she is depressed.  She denies sleep walking, sleep talking, bruxism, or nightmares.  There is no history of restless legs.  She denies sleep hallucinations, sleep paralysis, or cataplexy.  The Epworth score is 5 out of 24.   Physical Exam:   Appearance - well kempt    ENMT - no sinus tenderness, no oral exudate, no LAN, Mallampati 4 airway, no stridor, scalloped tongue  Respiratory - equal breath sounds bilaterally, no wheezing or rales  CV - s1s2 regular rate and rhythm, no murmurs  Ext - no clubbing, no edema  Skin - no rashes  Psych - normal mood and affect   Sleep Tests:    Social History:  She  reports that she has never smoked. She has never used smokeless tobacco. She reports previous alcohol use. She reports that she does not use drugs.  Family History:  Her family history includes Brain cancer in her maternal uncle; Colon cancer in her maternal grandfather; Diabetes in her brother, father, maternal grandfather, maternal grandmother, mother, paternal grandfather, paternal grandmother, and sister; Heart murmur in her father; Hypertension in her brother, father, maternal grandfather, maternal grandmother, mother, paternal grandfather, paternal grandmother, and sister; Ovarian cancer in her maternal grandmother; Rheum arthritis in her mother and sister; Stroke in her maternal grandfather and maternal grandmother.    Discussion:  She has difficulty falling asleep and staying asleep.  She has a delayed sleep phase.  She also has snoring, sleep disruption, apnea, and daytime sleepiness.  Her sleep issues are likely multifactorial.  Assessment/Plan:   Snoring with excessive daytime sleepiness. - will need to arrange for a home sleep study  Insomnia. - discussed stimulus control, sleep restriction and relaxation techniques - defer addition of sleep aide medication until after review of her sleep study  Delayed sleep phase. - advised her to avoid bright light source to close to bed time - advised her to get natural light source exposure shortly after waking up  Obesity. - discussed how weight can impact sleep and risk for sleep disordered breathing - discussed options to assist with weight loss: combination of diet modification,  cardiovascular and strength training exercises  Cardiovascular risk. - had an extensive discussion regarding the adverse health consequences related to untreated sleep disordered breathing - specifically discussed the risks for hypertension, coronary artery disease, cardiac dysrhythmias, cerebrovascular disease, and diabetes - lifestyle modification discussed  Safe driving practices. - discussed how sleep disruption can increase risk of accidents, particularly when driving - safe driving practices were discussed  Therapies for obstructive sleep apnea. - if the sleep study shows significant sleep apnea, then various therapies for treatment were reviewed: CPAP, oral appliance, and surgical interventions   Time Spent Involved in Patient Care on Day of Examination:  33 minutes  Follow up:   Patient Instructions  Will arrange for home sleep study Will call to arrange for follow up after sleep study reviewed  Medication List:   Allergies as of 03/09/2021   No Known Allergies      Medication List    as of March 09, 2021  3:35 PM   You have not been prescribed any medications.     Signature:  Chesley Mires, MD [place] Pager - [phone] 03/09/2021, 3:35 PM

## 2021-04-05 ENCOUNTER — Other Ambulatory Visit: Payer: Self-pay | Admitting: Nurse Practitioner

## 2021-04-05 ENCOUNTER — Encounter: Payer: Self-pay | Admitting: Nurse Practitioner

## 2021-04-05 DIAGNOSIS — Z1231 Encounter for screening mammogram for malignant neoplasm of breast: Secondary | ICD-10-CM

## 2021-04-08 ENCOUNTER — Other Ambulatory Visit: Payer: Self-pay

## 2021-04-08 ENCOUNTER — Ambulatory Visit
Admission: RE | Admit: 2021-04-08 | Discharge: 2021-04-08 | Disposition: A | Discharge status: Home / Self Care | Payer: 59 | Source: Ambulatory Visit | Attending: Nurse Practitioner | Admitting: Nurse Practitioner

## 2021-04-08 DIAGNOSIS — Z1231 Encounter for screening mammogram for malignant neoplasm of breast: Secondary | ICD-10-CM

## 2021-04-20 IMAGING — MG DIGITAL SCREENING BILAT W/ CAD
4 series · 4 of 4 positions shown · non-contrast
Comparison: None.

CLINICAL DATA: Screening.

EXAM:
DIGITAL SCREENING BILATERAL MAMMOGRAM WITH CAD

[R MLO]
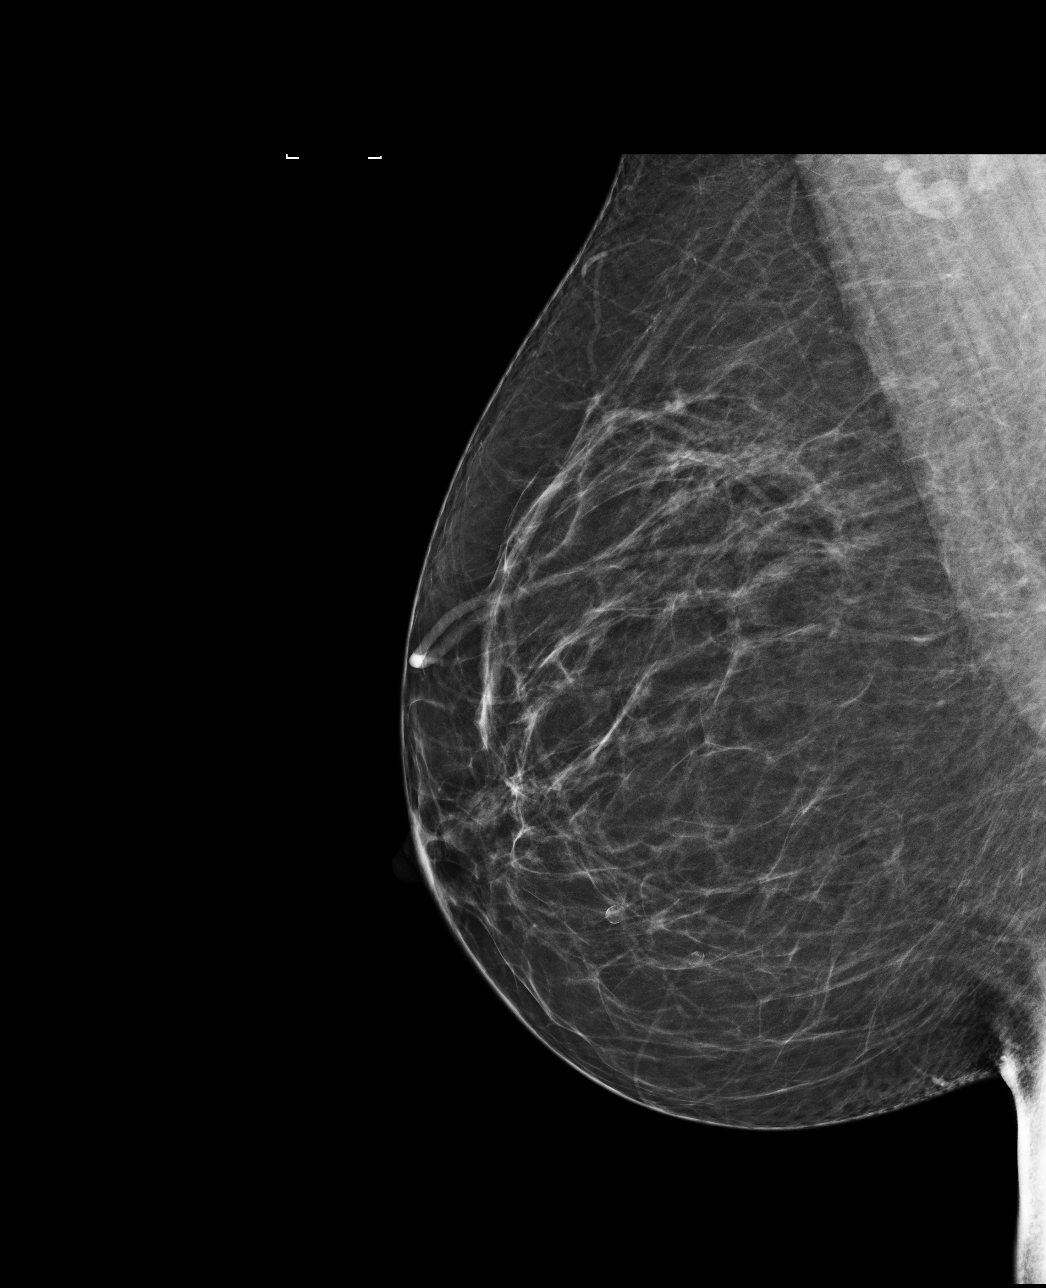

[R CC]
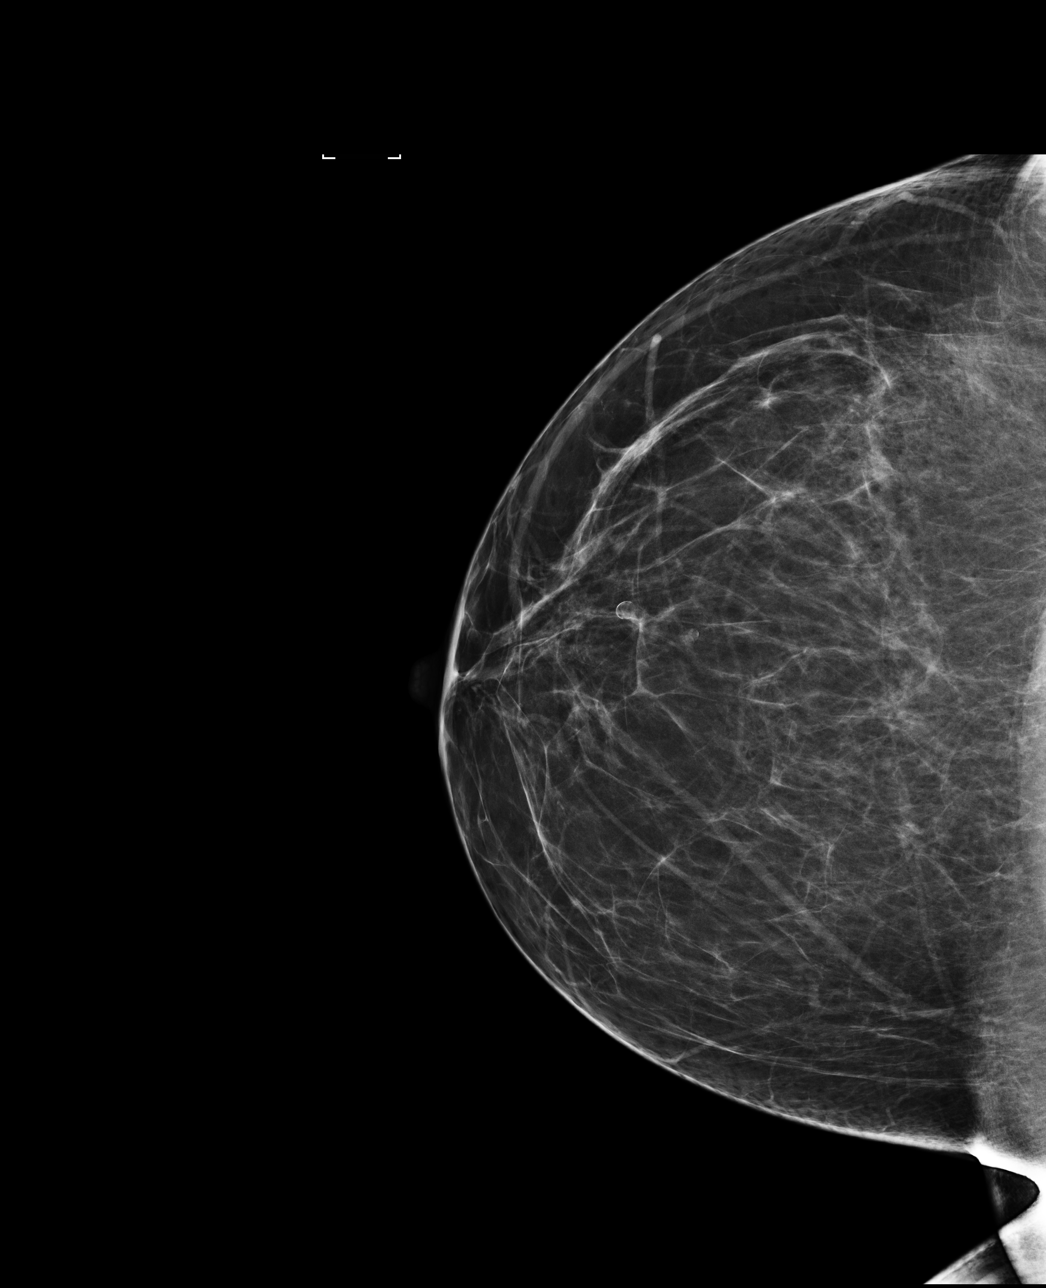

[L CC]
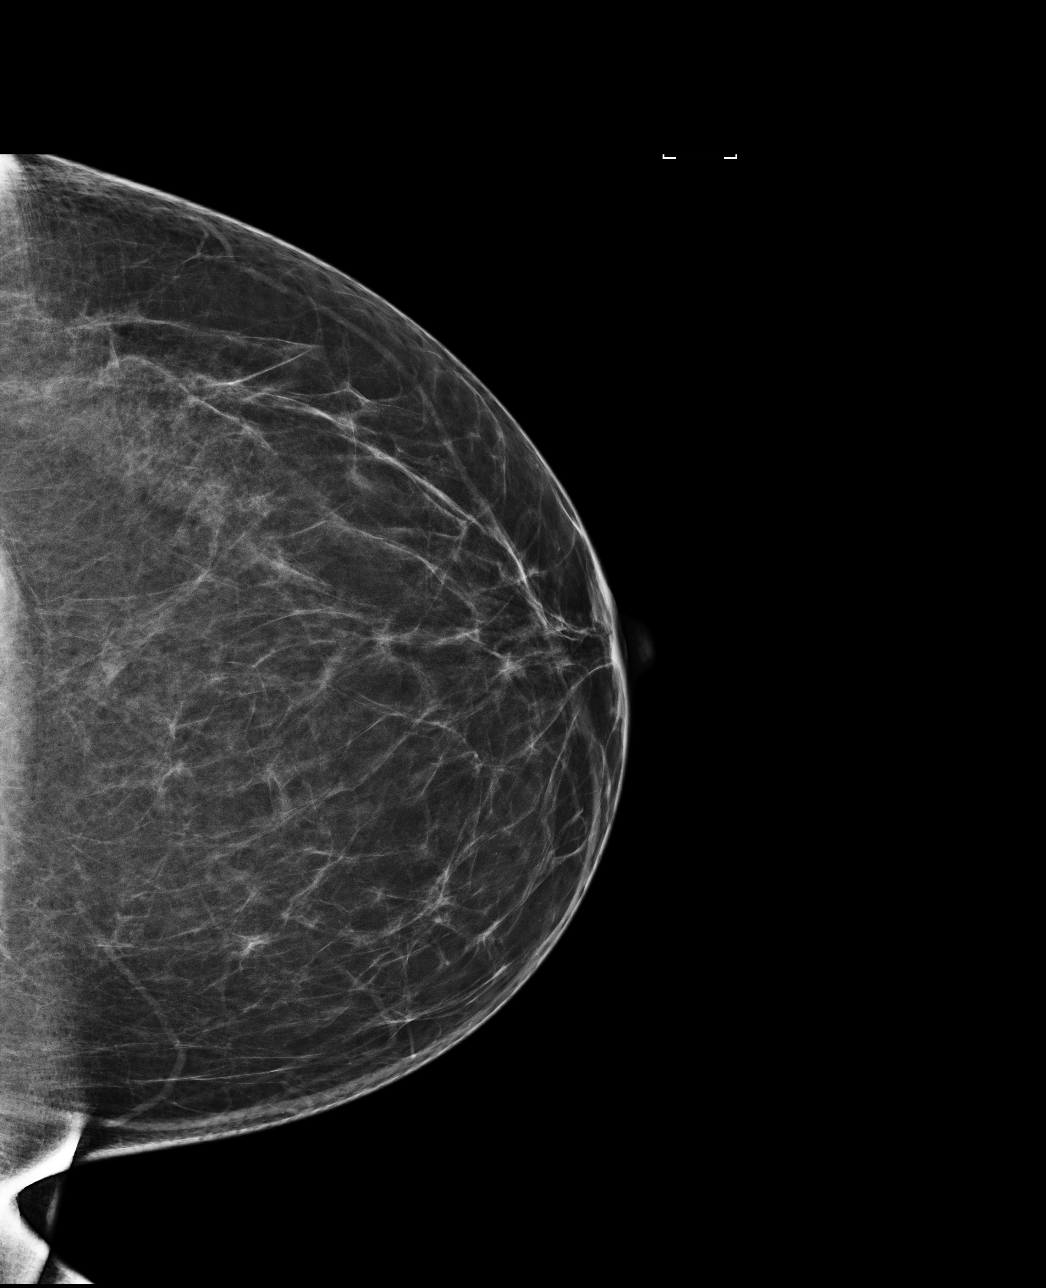

[L MLO]
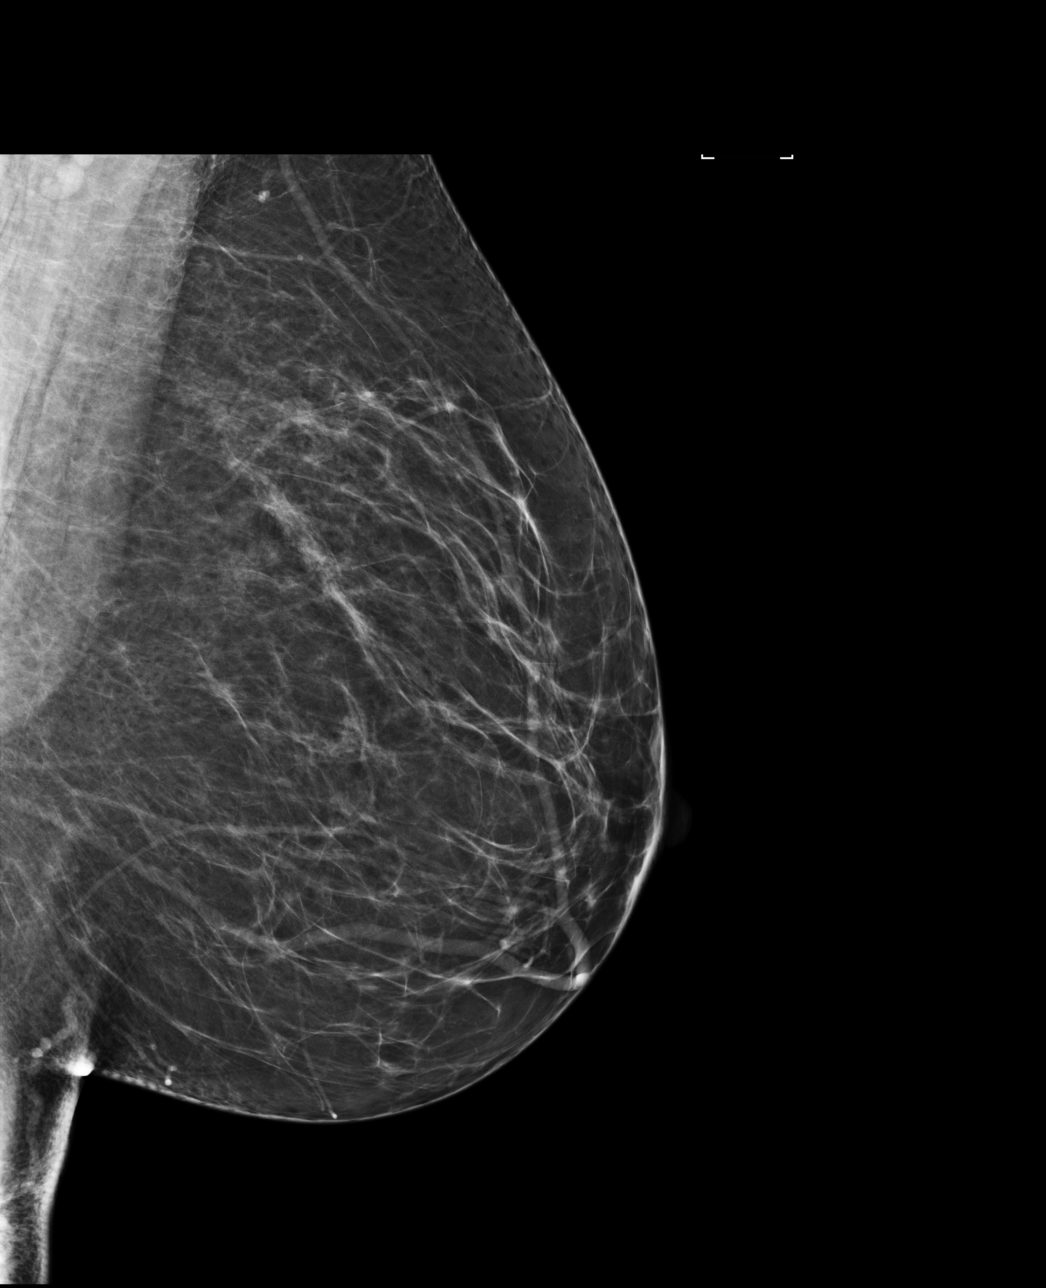

[4 of 4 positions shown; findings below may reference images not displayed]

ACR Breast Density Category b: There are scattered areas of
fibroglandular density.
FINDINGS: There are no findings suspicious for malignancy. Images were
processed with CAD.
IMPRESSION: No mammographic evidence of malignancy. A result letter of this
screening mammogram will be mailed directly to the patient.

RECOMMENDATION:
Screening mammogram in one year. (Code:[id number])

BI-RADS CATEGORY  1: Negative.

## 2021-04-21 ENCOUNTER — Encounter (INDEPENDENT_AMBULATORY_CARE_PROVIDER_SITE_OTHER): Payer: Self-pay

## 2021-05-13 DIAGNOSIS — Z0289 Encounter for other administrative examinations: Secondary | ICD-10-CM

## 2021-06-03 DIAGNOSIS — Z0289 Encounter for other administrative examinations: Secondary | ICD-10-CM

## 2021-06-04 ENCOUNTER — Other Ambulatory Visit (HOSPITAL_COMMUNITY): Payer: Self-pay | Admitting: Surgery

## 2021-06-04 ENCOUNTER — Other Ambulatory Visit: Payer: Self-pay | Admitting: Surgery

## 2021-06-08 ENCOUNTER — Ambulatory Visit (INDEPENDENT_AMBULATORY_CARE_PROVIDER_SITE_OTHER): Payer: 59 | Admitting: Bariatrics

## 2021-06-21 ENCOUNTER — Ambulatory Visit (HOSPITAL_COMMUNITY)
Admission: RE | Admit: 2021-06-21 | Discharge: 2021-06-21 | Disposition: A | Discharge status: Home / Self Care | Payer: 59 | Source: Ambulatory Visit | Attending: Surgery | Admitting: Surgery

## 2021-06-21 ENCOUNTER — Other Ambulatory Visit: Payer: Self-pay

## 2021-06-22 ENCOUNTER — Ambulatory Visit (INDEPENDENT_AMBULATORY_CARE_PROVIDER_SITE_OTHER): Payer: 59 | Admitting: Bariatrics

## 2021-07-26 ENCOUNTER — Encounter: Payer: Self-pay | Admitting: Nurse Practitioner

## 2021-07-26 ENCOUNTER — Ambulatory Visit (INDEPENDENT_AMBULATORY_CARE_PROVIDER_SITE_OTHER): Payer: 59 | Admitting: Nurse Practitioner

## 2021-07-26 ENCOUNTER — Other Ambulatory Visit: Payer: Self-pay

## 2021-07-26 VITALS — BP 130/90 | HR 82 | Ht 66.0 in | Wt 263.4 lb

## 2021-07-26 DIAGNOSIS — J358 Other chronic diseases of tonsils and adenoids: Secondary | ICD-10-CM

## 2021-07-26 DIAGNOSIS — N644 Mastodynia: Secondary | ICD-10-CM

## 2021-07-26 DIAGNOSIS — L819 Disorder of pigmentation, unspecified: Secondary | ICD-10-CM | POA: Diagnosis not present

## 2021-07-26 NOTE — Progress Notes (Signed)
Subjective:    Patient ID: Michelle Holloway, female    DOB: February 24, 1981, 40 y.o.   MRN: [id number]  HPI: Michelle Holloway is a 40 y.o. female presenting with multiple concerns.  Chief Complaint  Patient presents with   Follow-up    Feels like things get stuck in throat denies trouble swallowing    Patient reports white spots in the back of her throat.  It was concerning to her, so she took a Q-tip and pressed on it until it came out.  She says it did not smell and she does not have bad breath.  She denies sore throat, painful swallowing, swollen glands, fevers.  She is wondering what this was and what can be done to prevent them.  Patient reports she has had strep throat twice that she can remember in her life.  BREAST PAIN Duration : years Location: right breast from side of breast to nipple; right upper outer quadrant; also occasionally has pain in her left breast along the same area Onset: sudden; comes and goes quickly Severity: moderate to severe   Quality:electric Frequency: multiple times daily for 2 days in a row and then nothing for a couple of months.  Redness: no Swelling: no Trauma: no trauma  Breastfeeding: no Associated with menstral cycle: does not have menses; hysterectomy (ovaries are in place) Nipple discharge: no Breast lump: no Status: stable Treatments attempted:  Previous mammogram: yes  Also reports she has noticed her skin is becoming more discolored on her lower extremities.  She reports it started as small light patches, now they are dark patches.  They do not itch, are not red, do not ooze anything.  She does use lotion daily and this has not changed.  This has been a gradual process.  No Known Allergies  No outpatient encounter medications on file as of 07/26/2021.   No facility-administered encounter medications on file as of 07/26/2021.    Patient Active Problem List   Diagnosis Date Noted   Prediabetes 01/28/2021   Swelling of lower extremity  01/28/2021   BMI 39.0-39.9,adult 01/28/2021   Breast pain 08/31/2020   LLQ pain 08/31/2020    Past Medical History:  Diagnosis Date   Adenomyosis    Blood transfusion without reported diagnosis    Endometriosis    Fibroid    H/O total hysterectomy    Infertility, female    Ovarian cyst    Pain of toe of left foot 08/31/2020    Relevant past medical, surgical, family and social history reviewed and updated as indicated. Interim medical history since our last visit reviewed.  Review of Systems Per HPI unless specifically indicated above     Objective:    BP 130/90   Pulse 82   Ht 5\' 6"  (1.676 m)   Wt 263 lb 6.4 oz (119.5 kg)   LMP 08/30/2017 (Approximate)   SpO2 97%   BMI 42.51 kg/m   Wt Readings from Last 3 Encounters:  07/26/21 263 lb 6.4 oz (119.5 kg)  03/09/21 256 lb 12.8 oz (116.5 kg)  01/28/21 253 lb 3.2 oz (114.9 kg)    Physical Exam Vitals and nursing note reviewed. Exam conducted with a chaperone present [employer] Goins, CMA).  HENT:     Nose: Nose normal.     Right Turbinates: Not enlarged or swollen.     Left Turbinates: Not enlarged or swollen.     Mouth/Throat:     Lips: No lesions.     Mouth: Mucous membranes  are moist.     Palate: No mass and lesions.     Pharynx: Oropharynx is clear. No oropharyngeal exudate or posterior oropharyngeal erythema.     Tonsils: No tonsillar exudate or tonsillar abscesses.  Eyes:     General:        Right eye: No discharge.        Left eye: No discharge.     Extraocular Movements: Extraocular movements intact.  Pulmonary:     Effort: Pulmonary effort is normal. No respiratory distress.  Chest:     Chest wall: Tenderness present.  Breasts:    Breasts are symmetrical.     Right: Normal. No swelling, bleeding, inverted nipple, mass, nipple discharge or skin change.     Left: Normal. No swelling, bleeding, inverted nipple, mass, nipple discharge or skin change.    Musculoskeletal:     Cervical back: Normal range of  motion.     Right lower leg: Edema present.     Left lower leg: Edema present.     Comments: Trace edema to lower extremities bilatearlly  Lymphadenopathy:     Cervical: No cervical adenopathy.     Upper Body:     Right upper body: No supraclavicular, axillary or pectoral adenopathy.     Left upper body: No supraclavicular, axillary or pectoral adenopathy.  Skin:    General: Skin is warm and dry.     Coloration: Skin is not jaundiced or pale.     Findings: No erythema.     Comments: Patches of hyperpigemented skin noted to bilateral lower extremities  Neurological:     Mental Status: She is oriented to person, place, and time.  Psychiatric:        Mood and Affect: Mood normal.        Behavior: Behavior normal.        Thought Content: Thought content normal.        Judgment: Judgment normal.      Assessment & Plan:  1. Tonsillith Acute.  Discussed that this can be associated with bad breath and treatment is not indicated unless symptomatic with frequent strep throat infection/sore throat.    2. Breast pain Acute on chronic.  Chest wall tender to palpation today, however no masses or abnormalities palpated.  Will obtain diagnostic mammogram with ultrasound.    - MM Digital Diagnostic Bilat; Future  3. Discoloration of skin of lower leg Reassurance provided that this may normal age related skin changes.  Offered referral to Dermatology and patient desires.  Referral placed.   - Ambulatory referral to Dermatology    Follow up plan: Return if symptoms worsen or fail to improve.

## 2021-08-04 ENCOUNTER — Ambulatory Visit: Payer: 59 | Admitting: Nurse Practitioner

## 2021-08-05 ENCOUNTER — Encounter: Payer: Self-pay | Admitting: Skilled Nursing Facility1

## 2021-08-05 ENCOUNTER — Other Ambulatory Visit: Payer: 59

## 2021-08-05 ENCOUNTER — Other Ambulatory Visit: Payer: Self-pay

## 2021-08-05 ENCOUNTER — Encounter: Payer: 59 | Attending: Surgery | Admitting: Skilled Nursing Facility1

## 2021-08-05 ENCOUNTER — Other Ambulatory Visit: Payer: Self-pay | Admitting: Nurse Practitioner

## 2021-08-05 DIAGNOSIS — R7303 Prediabetes: Secondary | ICD-10-CM

## 2021-08-05 DIAGNOSIS — Z0000 Encounter for general adult medical examination without abnormal findings: Secondary | ICD-10-CM

## 2021-08-05 DIAGNOSIS — E785 Hyperlipidemia, unspecified: Secondary | ICD-10-CM

## 2021-08-05 DIAGNOSIS — Z8639 Personal history of other endocrine, nutritional and metabolic disease: Secondary | ICD-10-CM

## 2021-08-05 DIAGNOSIS — E669 Obesity, unspecified: Secondary | ICD-10-CM | POA: Insufficient documentation

## 2021-08-05 DIAGNOSIS — N644 Mastodynia: Secondary | ICD-10-CM

## 2021-08-05 NOTE — Progress Notes (Signed)
Nutrition Assessment for Bariatric Surgery Medical Nutrition Therapy  Patient was seen on 08/05/2021 for Pre-Operative Nutrition Assessment. Letter of approval faxed to [facility] bariatric surgery program coordinator on 08/05/2021.   Referral stated Supervised Weight Loss (SWL) visits needed: 6  Planned surgery: sleeve gastrectomy  Pt expectation of surgery: to lose weight Pt expectation of dietitian: none stated    NUTRITION ASSESSMENT   Anthropometrics  Start weight at [facility]: 262.8 lbs (date: 08/05/2021)  Height: 66 in BMI: 42.42 kg/m2     Clinical  Medical hx: N/A Medications: N/A  Labs: not read yet Notable signs/symptoms: N/A Any previous deficiencies? Vitamin D, anemia  Micronutrient Nutrition Focused Physical Exam: Hair: No issues observed Eyes: No issues observed Mouth: No issues observed Neck: No issues observed Nails: No issues observed Skin: No issues observed  Lifestyle & Dietary Hx  Pt states she rides her peleoton about 4-5 days a week for 30 minutes.   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 Pt state she has been working on portions sizes and healthy foods, vegetarianism was discussed.   24-Hr Dietary Recall First Meal: oatmeal or bagel + cream cheese or cereal or pancakes and sausage Snack: nuts Second Meal: sandwich or salad Snack:  Third Meal: chicken + carrots + wild rice Snack:  Beverages: water, sprite zero   Estimated Energy Needs Calories: 1600   NUTRITION DIAGNOSIS  Overweight/obesity ([place]-3.3) related to past poor dietary habits and physical inactivity as evidenced by patient w/ planned sleeve gastrectomy surgery following dietary guidelines for continued weight loss.    NUTRITION INTERVENTION  Nutrition counseling (C-1) and education (E-2) to facilitate bariatric surgery goals.  Educated pt on micronutrient deficiencies post surgery and strategies to mitigate that risk    Pre-Op Goals Reviewed with the Patient Track food and beverage intake (pen and paper, MyFitness Pal, Baritastic app, etc.) Make healthy food choices while monitoring portion sizes Consume 3 meals per day or try to eat every 3-5 hours Avoid concentrated sugars and fried foods Keep sugar & fat in the single digits per serving on food labels Practice CHEWING your food (aim for applesauce consistency) Practice not drinking 15 minutes before, during, and 30 minutes after each meal and snack Avoid all carbonated beverages (ex: soda, sparkling beverages)  Limit caffeinated beverages (ex: coffee, tea, energy drinks) Avoid all sugar-sweetened beverages (ex: regular soda, sports drinks)  Avoid alcohol  Aim for 64-100 ounces of FLUID daily (with at least half of fluid intake being plain water)  Aim for at least 60-80 grams of PROTEIN daily Look for a liquid protein source that contains ?15 g protein and ?5 g carbohydrate (ex: shakes, drinks, shots) Make a list of non-food related activities Physical activity is an important part of a healthy lifestyle so keep it moving! The goal is to reach 150 minutes of exercise per week, including cardiovascular and weight baring activity.  *Goals that are bolded indicate the pt would like to start working towards these  Handouts Provided Include  Bariatric Surgery handouts (Nutrition Visits, Pre-Op Goals, Protein Shakes, Vitamins & Minerals)  Learning Style & Readiness for Change Teaching method utilized: Visual & Auditory  Demonstrated degree of understanding via: Teach Back  Readiness Level: action Barriers to learning/adherence to lifestyle change: non stated  RD's Notes for Next Visit Assess pts adherence to chosen goals     MONITORING & EVALUATION Dietary intake, weekly physical activity, body weight, and pre-op goals reached at next nutrition  visit.    Next Steps  Patient is to follow up at [facility] for Pre-Op Class >2 weeks before surgery for  further nutrition education.

## 2021-08-06 ENCOUNTER — Encounter: Payer: Self-pay | Admitting: Nurse Practitioner

## 2021-08-06 ENCOUNTER — Ambulatory Visit (INDEPENDENT_AMBULATORY_CARE_PROVIDER_SITE_OTHER): Payer: 59 | Admitting: Nurse Practitioner

## 2021-08-06 VITALS — BP 116/78 | HR 91 | Ht 66.0 in | Wt 263.0 lb

## 2021-08-06 DIAGNOSIS — Z6841 Body Mass Index (BMI) 40.0 and over, adult: Secondary | ICD-10-CM | POA: Diagnosis not present

## 2021-08-06 DIAGNOSIS — E785 Hyperlipidemia, unspecified: Secondary | ICD-10-CM | POA: Diagnosis not present

## 2021-08-06 DIAGNOSIS — Z0000 Encounter for general adult medical examination without abnormal findings: Secondary | ICD-10-CM

## 2021-08-06 DIAGNOSIS — E1165 Type 2 diabetes mellitus with hyperglycemia: Secondary | ICD-10-CM

## 2021-08-06 DIAGNOSIS — R0602 Shortness of breath: Secondary | ICD-10-CM | POA: Insufficient documentation

## 2021-08-06 DIAGNOSIS — E559 Vitamin D deficiency, unspecified: Secondary | ICD-10-CM | POA: Insufficient documentation

## 2021-08-06 HISTORY — DX: Hyperlipidemia, unspecified: E78.5

## 2021-08-06 LAB — COMPREHENSIVE METABOLIC PANEL
AG Ratio: 1.4 (calc) (ref 1.0–2.5)
ALT: 15 U/L (ref 6–29)
AST: 13 U/L (ref 10–30)
Albumin: 3.8 g/dL (ref 3.6–5.1)
Alkaline phosphatase (APISO): 78 U/L (ref 31–125)
BUN: 10 mg/dL (ref 7–25)
CO2: 26 mmol/L (ref 20–32)
Calcium: 8.9 mg/dL (ref 8.6–10.2)
Chloride: 104 mmol/L (ref 98–110)
Creat: 0.86 mg/dL (ref 0.50–0.99)
Globulin: 2.7 g/dL (calc) (ref 1.9–3.7)
Glucose, Bld: 141 mg/dL — ABNORMAL HIGH (ref 65–99)
Potassium: 4.6 mmol/L (ref 3.5–5.3)
Sodium: 137 mmol/L (ref 135–146)
Total Bilirubin: 0.4 mg/dL (ref 0.2–1.2)
Total Protein: 6.5 g/dL (ref 6.1–8.1)

## 2021-08-06 LAB — CBC WITH DIFFERENTIAL/PLATELET
Absolute Monocytes: 540 cells/uL (ref 200–950)
Basophils Absolute: 38 cells/uL (ref 0–200)
Basophils Relative: 0.5 %
Eosinophils Absolute: 413 cells/uL (ref 15–500)
Eosinophils Relative: 5.5 %
HCT: 44.1 % (ref 35.0–45.0)
Hemoglobin: 14.5 g/dL (ref 11.7–15.5)
Lymphs Abs: 4673 cells/uL — ABNORMAL HIGH (ref 850–3900)
MCH: 28.6 pg (ref 27.0–33.0)
MCHC: 32.9 g/dL (ref 32.0–36.0)
MCV: 87 fL (ref 80.0–100.0)
MPV: 9.6 fL (ref 7.5–12.5)
Monocytes Relative: 7.2 %
Neutro Abs: 1838 cells/uL (ref 1500–7800)
Neutrophils Relative %: 24.5 %
Platelets: 279 10*3/uL (ref 140–400)
RBC: 5.07 10*6/uL (ref 3.80–5.10)
RDW: 13 % (ref 11.0–15.0)
Total Lymphocyte: 62.3 %
WBC: 7.5 10*3/uL (ref 3.8–10.8)

## 2021-08-06 LAB — LIPID PANEL
Cholesterol: 216 mg/dL — ABNORMAL HIGH (ref <200)
HDL: 66 mg/dL (ref >=50)
LDL Cholesterol (Calc): 131 mg/dL (calc) — ABNORMAL HIGH
Non-HDL Cholesterol (Calc): 150 mg/dL (calc) — ABNORMAL HIGH (ref <130)
Total CHOL/HDL Ratio: 3.3 (calc) (ref <5.0)
Triglycerides: 91 mg/dL (ref <150)

## 2021-08-06 LAB — HEMOGLOBIN A1C
Hgb A1c MFr Bld: 6.7 % of total Hgb — ABNORMAL HIGH (ref <5.7)
Mean Plasma Glucose: 146 mg/dL
eAG (mmol/L): 8.1 mmol/L

## 2021-08-06 LAB — VITAMIN D 25 HYDROXY (VIT D DEFICIENCY, FRACTURES): Vit D, 25-Hydroxy: 15 ng/mL — ABNORMAL LOW (ref 30–100)

## 2021-08-06 MED ORDER — ALBUTEROL SULFATE HFA 108 (90 BASE) MCG/ACT IN AERS: 2.0000 | INHALATION_SPRAY | Freq: Four times a day (QID) | RESPIRATORY_TRACT | 0 refills | Status: DC | PRN

## 2021-08-06 NOTE — Assessment & Plan Note (Signed)
Ongoing.  Will give albuterol inhaler for rescue purposes.I educated the patient to notify us or come back to the office if she is using this more than 1-2 times weekly.  We can perform formal lung testing in the future if she is using albuterol more frequently.

## 2021-08-06 NOTE — Assessment & Plan Note (Signed)
Chronic.  Patient is continue to work on dietary and lifestyle changes -planning to have gastric sleeve surgery next year.  LDL goal less than 100.

## 2021-08-06 NOTE — Assessment & Plan Note (Signed)
Chronic.  She has been working on dietary and lifestyle changes and does stay physically active.  She has arranged to have gastric sleeve surgery next year.  Reports her sister and mother did well with this procedure.

## 2021-08-06 NOTE — Assessment & Plan Note (Addendum)
Recent A1c 6.7%.  Patient declined starting medication at this time-wants to continue working on dietary and lifestyle changes while she is awaiting gastric surgery.  I feel this is reasonable at this time.  Follow-up 6 months.

## 2021-08-06 NOTE — Assessment & Plan Note (Signed)
Chronic.  Discussed importance of taking vitamin D daily.  Patient reports she will work on this.  Recheck 6 months.

## 2021-08-06 NOTE — Progress Notes (Signed)
BP 116/78    Pulse 91    Ht 5\' 6"  (1.676 m)    Wt 263 lb (119.3 kg)    LMP 08/30/2017 (Approximate)    SpO2 96%    BMI 42.45 kg/m    Subjective:    Patient ID: Michelle Holloway, female    DOB: April 18, 1981, 40 y.o.   MRN: [id number]  HPI: Michelle Holloway is a 40 y.o. female presenting on 08/06/2021 for comprehensive medical examination. Current medical complaints include: none  Patient reports she is doing well overall.  She had her first meeting with nutritionist yesterday-she is planning to have gastric sleeve surgery next year.    She has not been taking vitamin D consistently.  Upon review of systems, she does report some increased redness of breath going up stairs.  She reports this has been worsening since she had COVID.  She does stay physically active and reports she has not noted shortness of breath when she is doing a workout.  She does not have any chest pain with the shortness of breath.  No palpitations.  No nausea vomiting or diaphoresis.  She recently had a loss in her family and is traveling early next week for a funeral.  She reports she is coping okay with this.  Some days are hard, but for the most part she is doing okay.  She currently lives with: husband and daughter and godson  LMP:  hysterectomy  Depression Screen done today and results listed below:  Depression screen [facility] 2/9 08/06/2021 08/05/2021 02/27/2020  Decreased Interest 0 0 0  Down, Depressed, Hopeless 0 0 0  PHQ - 2 Score 0 0 0  Difficult doing work/chores Not difficult at all - -   The patient does not have a history of falls. I did not complete a risk assessment for falls. A plan of care for falls was not documented.   Past Medical History:  Past Medical History:  Diagnosis Date   Adenomyosis    Blood transfusion without reported diagnosis    Endometriosis    Fibroid    H/O total hysterectomy    Hyperlipidemia 08/06/2021   Infertility, female    LLQ pain 08/31/2020   Ovarian cyst    Pain of  toe of left foot 08/31/2020    Surgical History:  Past Surgical History:  Procedure Laterality Date   ABDOMINAL HYSTERECTOMY N/A 05/2019   DILATION AND CURETTAGE OF UTERUS      Medications:  Current Outpatient Medications on File Prior to Visit  Medication Sig   VITAMIN D PO Take by mouth.   No current facility-administered medications on file prior to visit.    Allergies:  No Known Allergies  Social History:  Social History   Socioeconomic History   Marital status: Married    Spouse name: Not on file   Number of children: Not on file   Years of education: Not on file   Highest education level: Not on file  Occupational History   Not on file  Tobacco Use   Smoking status: Never   Smokeless tobacco: Never  Vaping Use   Vaping Use: Never used  Substance and Sexual Activity   Alcohol use: Not Currently   Drug use: Never   Sexual activity: Yes    Partners: Male    Birth control/protection: Surgical    Comment: hysterectomy  Other Topics Concern   Not on file  Social History Narrative   Not on file   Social Determinants of Health  Financial Resource Strain: Not on file  Food Insecurity: Not on file  Transportation Needs: Not on file  Physical Activity: Not on file  Stress: Not on file  Social Connections: Not on file  Intimate Partner Violence: Not on file   Social History   Tobacco Use  Smoking Status Never  Smokeless Tobacco Never   Social History   Substance and Sexual Activity  Alcohol Use Not Currently    Family History:  Family History  Problem Relation Age of Onset   Diabetes Mother    Hypertension Mother    Rheum arthritis Mother    Diabetes Father    Hypertension Father    Heart murmur Father    Diabetes Sister    Hypertension Sister    Rheum arthritis Sister    Diabetes Brother    Hypertension Brother    Diabetes Paternal Grandmother    Hypertension Paternal Grandmother    Brain cancer Maternal Uncle    Ovarian cancer  Maternal Grandmother    Diabetes Maternal Grandmother    Hypertension Maternal Grandmother    Stroke Maternal Grandmother    Colon cancer Maternal Grandfather    Diabetes Maternal Grandfather    Hypertension Maternal Grandfather    Stroke Maternal Grandfather    Diabetes Paternal Grandfather    Hypertension Paternal Grandfather     Past medical history, surgical history, medications, allergies, family history and social history reviewed with patient today and changes made to appropriate areas of the chart.   Review of Systems  Constitutional: Negative.   HENT: Negative.    Eyes: Negative.   Respiratory:  Positive for shortness of breath (With exertion-worsening) and wheezing (With shortness of breath and exertion). Negative for cough and sputum production.   Cardiovascular: Negative.   Gastrointestinal: Negative.   Genitourinary: Negative.   Musculoskeletal: Negative.   Skin: Negative.   Neurological: Negative.   Psychiatric/Behavioral: Negative.        Objective:    BP 116/78    Pulse 91    Ht 5\' 6"  (1.676 m)    Wt 263 lb (119.3 kg)    LMP 08/30/2017 (Approximate)    SpO2 96%    BMI 42.45 kg/m   Wt Readings from Last 3 Encounters:  08/06/21 263 lb (119.3 kg)  08/05/21 262 lb 12.8 oz (119.2 kg)  07/26/21 263 lb 6.4 oz (119.5 kg)    Physical Exam Vitals and nursing note reviewed.  Constitutional:      General: She is not in acute distress.    Appearance: Normal appearance. She is obese. She is not ill-appearing or toxic-appearing.  HENT:     Head: Normocephalic and atraumatic.     Right Ear: Tympanic membrane, ear canal and external ear normal. There is no impacted cerumen.     Left Ear: Tympanic membrane, ear canal and external ear normal. There is no impacted cerumen.     Nose: Nose normal. No congestion or rhinorrhea.     Mouth/Throat:     Mouth: Mucous membranes are moist.     Pharynx: Oropharynx is clear. No oropharyngeal exudate.  Eyes:     General: No scleral  icterus.    Extraocular Movements: Extraocular movements intact.     Pupils: Pupils are equal, round, and reactive to light.  Cardiovascular:     Rate and Rhythm: Normal rate and regular rhythm.     Pulses: Normal pulses.     Heart sounds: Normal heart sounds. No murmur heard. Pulmonary:     Effort:  Pulmonary effort is normal. No respiratory distress.     Breath sounds: Normal breath sounds. No wheezing, rhonchi or rales.  Chest:  Breasts:    Right: Normal. No inverted nipple, mass, nipple discharge or skin change.     Left: Normal. No inverted nipple, mass, nipple discharge or skin change.  Abdominal:     General: Abdomen is flat. Bowel sounds are normal. There is no distension.     Palpations: Abdomen is soft.     Tenderness: There is no abdominal tenderness.  Genitourinary:    Comments: Deferred - patient sees OB/GYN Musculoskeletal:        General: No swelling or tenderness. Normal range of motion.     Cervical back: Normal range of motion and neck supple. No rigidity or tenderness.     Right lower leg: No edema.     Left lower leg: No edema.  Skin:    General: Skin is warm and dry.     Capillary Refill: Capillary refill takes less than 2 seconds.     Coloration: Skin is not jaundiced or pale.  Neurological:     General: No focal deficit present.     Mental Status: She is alert and oriented to person, place, and time.     Motor: No weakness.     Gait: Gait normal.  Psychiatric:        Mood and Affect: Mood normal.        Behavior: Behavior normal.        Thought Content: Thought content normal.        Judgment: Judgment normal.      Assessment & Plan:   Problem List Items Addressed This Visit       Other   Vitamin D deficiency    Chronic.  Discussed importance of taking vitamin D daily.  Patient reports she will work on this.  Recheck 6 months.      SOB (shortness of breath) on exertion    Ongoing.  Will give albuterol inhaler for rescue purposes.I educated the  patient to notify us or come back to the office if she is using this more than 1-2 times weekly.  We can perform formal lung testing in the future if she is using albuterol more frequently.      Prediabetes    Recent A1c 6.7%.  Patient declined starting medication at this time-wants to continue working on dietary and lifestyle changes while she is awaiting gastric surgery.  I feel this is reasonable at this time.  Follow-up 6 months.      Hyperlipidemia    Chronic.  Patient is continue to work on dietary and lifestyle changes -planning to have gastric sleeve surgery next year.  LDL goal less than 100.       BMI 39.0-39.9,adult    Chronic.  She has been working on dietary and lifestyle changes and does stay physically active.  She has arranged to have gastric sleeve surgery next year.  Reports her sister and mother did well with this procedure.      Other Visit Diagnoses     Annual physical exam    -  Primary        Follow up plan: Return in about 6 months (around 02/04/2022) for Diabetes, high cholesterol follow-up.   LABORATORY TESTING:  - Pap smear: not applicable  IMMUNIZATIONS:   - Tdap: Tetanus vaccination status reviewed: declines today - Influenza: declines today - Pneumovax: not applicable - Prevnar: not applicable - HPV:  Not applicable - Zostavax vaccine: Not applicable - [id number] vaccine: has had 2 doses  SCREENING: -Mammogram: Up to date  - Colonoscopy: Not applicable  - Bone Density: Not applicable  -Hearing Test: Not applicable  -Spirometry: Not applicable   PATIENT COUNSELING:   Advised to take 1 mg of folate supplement per day if capable of pregnancy.   Sexuality: Discussed sexually transmitted diseases, partner selection, use of condoms, avoidance of unintended pregnancy  and contraceptive alternatives.   Advised to avoid cigarette smoking.  I discussed with the patient that most people either abstain from alcohol or drink within safe limits  (<=14/week and <=4 drinks/occasion for males, <=7/weeks and <= 3 drinks/occasion for females) and that the risk for alcohol disorders and other health effects rises proportionally with the number of drinks per week and how often a drinker exceeds daily limits.  Discussed cessation/primary prevention of drug use and availability of treatment for abuse.   Diet: Encouraged to adjust caloric intake to maintain  or achieve ideal body weight, to reduce intake of dietary saturated fat and total fat, to limit sodium intake by avoiding high sodium foods and not adding table salt, and to maintain adequate dietary potassium and calcium preferably from fresh fruits, vegetables, and low-fat dairy products.    stressed the importance of regular exercise  Injury prevention: Discussed safety belts, safety helmets, smoke detector, smoking near bedding or upholstery.   Dental health: Discussed importance of regular tooth brushing, flossing, and dental visits.    NEXT PREVENTATIVE PHYSICAL DUE IN 1 YEAR. Return in about 6 months (around 02/04/2022) for Diabetes, high cholesterol follow-up.

## 2021-08-09 ENCOUNTER — Ambulatory Visit: Payer: 59 | Admitting: Skilled Nursing Facility1

## 2021-09-08 ENCOUNTER — Other Ambulatory Visit: Payer: Self-pay

## 2021-09-08 ENCOUNTER — Encounter: Payer: 59 | Attending: Surgery | Admitting: Skilled Nursing Facility1

## 2021-09-08 DIAGNOSIS — E669 Obesity, unspecified: Secondary | ICD-10-CM | POA: Insufficient documentation

## 2021-09-08 NOTE — Progress Notes (Signed)
Supervised Weight Loss Visit Bariatric Nutrition Education  Planned Surgery: sleeve gastrectomy   1 out of 6 SWL Appointments   NUTRITION ASSESSMENT  Anthropometrics  Start weight at [facility]: 262.8 lbs (date: 08/05/2021) Today's weight: 263 BMI: 42.45 kg/m2    Clinical  Medical hx: N/A Medications: N/A  Labs: not read yet Notable signs/symptoms: ocular migraines  Any previous deficiencies? Vitamin D, anemia  Lifestyle & Dietary Hx    Body Composition Scale 09/08/2021  Current Body Weight 263  Total Body Fat % 45.1  Visceral Fat 15  Fat-Free Mass % 54.8   Total Body Water % 41.9  Muscle-Mass lbs 33.7  BMI 42.4  Body Fat Displacement          Torso  lbs 73.6         Left Leg  lbs 14.7         Right Leg  lbs 14.7         Left Arm  lbs 7.3         Right Arm   lbs 7.3   Pt state she wakes at 7:30am.  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  Pt states her husband is really supportive. Pt states her and her husband have someone cook her dinners.   Estimated daily fluid intake: 48 oz Supplements:  Current average weekly physical activity: [place] 3 times a week 30 minutes   24-Hr Dietary Recall First Meal: skipped Snack:  Second Meal 2pm:  salad or shrimp + mashed potatoes + broccoli  Snack: sometimes smoothie Third Mea 8:30-9: 2 handfuls nuts + cranberries Snack:  Beverages: 16 sprite zero, 32 ounces water  Estimated Energy Needs Calories: 1600   NUTRITION DIAGNOSIS  Overweight/obesity ([place]-3.3) related to past poor dietary habits and physical inactivity as evidenced by patient w/ planned sleeve gastrectomy surgery following dietary guidelines for continued weight loss.   NUTRITION INTERVENTION  Nutrition counseling (C-1) and education (E-2) to facilitate bariatric surgery goals.  Pre-Op Goals Progress & New Goals NEW: Aim for 4 day a week 40 minutes:  stretch after: looking up hip flexor, lower back, quads,  hammies, and core NEW: Have that dinner person make enough so you have lunch and dinner NEW: Have breakfast NEW: Add in one more bottle of water   Handouts Provided Include    Learning Style & Readiness for Change Teaching method utilized: Visual & Auditory  Demonstrated degree of understanding via: Teach Back  Readiness Level: action Barriers to learning/adherence to lifestyle change: none identified  RD's Notes for next Visit  Assess pts adherence to chosen goals   MONITORING & EVALUATION Dietary intake, weekly physical activity, body weight, and pre-op goals in 1 month.   Next Steps  Patient is to return to [facility] in one month

## 2021-09-09 ENCOUNTER — Ambulatory Visit: Payer: 59 | Admitting: Skilled Nursing Facility1

## 2021-09-14 ENCOUNTER — Other Ambulatory Visit: Payer: 59

## 2021-10-05 ENCOUNTER — Ambulatory Visit (HOSPITAL_BASED_OUTPATIENT_CLINIC_OR_DEPARTMENT_OTHER): Payer: Self-pay | Admitting: Nurse Practitioner

## 2021-10-07 ENCOUNTER — Other Ambulatory Visit: Payer: Self-pay

## 2021-10-07 ENCOUNTER — Encounter: Payer: 59 | Attending: Surgery | Admitting: Skilled Nursing Facility1

## 2021-10-07 DIAGNOSIS — E669 Obesity, unspecified: Secondary | ICD-10-CM | POA: Diagnosis not present

## 2021-10-07 NOTE — Progress Notes (Signed)
Supervised Weight Loss Visit Bariatric Nutrition Education  Planned Surgery: sleeve gastrectomy   2 out of 6 SWL Appointments   NUTRITION ASSESSMENT  Anthropometrics  Start weight at [facility]: 262.8 lbs (date: 08/05/2021) Today's weight: 260.3 BMI: 42.06 kg/m2    Clinical  Medical hx: N/A Medications: N/A  Labs: not read yet Notable signs/symptoms: ocular migraines  Any previous deficiencies? Vitamin D, anemia  Lifestyle & Dietary Hx    Body Composition Scale 09/08/2021 10/07/2021  Current Body Weight 263 260.6  Total Body Fat % 45.1 44.9  Visceral Fat 15 15  Fat-Free Mass % 54.8 55   Total Body Water % 41.9 42  Muscle-Mass lbs 33.7 33.6  BMI 42.4 42  Body Fat Displacement           Torso  lbs 73.6 72.6         Left Leg  lbs 14.7 14.5         Right Leg  lbs 14.7 14.5         Left Arm  lbs 7.3 7.2         Right Arm   lbs 7.3 7.2     Pt states she has been working on eating more often throughout the day and smaller portions.  Pt states she is now up to 4 bottles of water a day.  Pt states she wishes to Maintain thses changes before she adds other changes to ensure she continues with her progress.   Estimated daily fluid intake: 48 oz Supplements:  Current average weekly physical activity: [place] 3 times a week 30 minutes   24-Hr Dietary Recall First Meal: toast + almond butter or smoothie Snack:  Second Meal 2pm:  salad or shrimp + mashed potatoes + broccoli  Snack: sometimes smoothie Third Mea 8:30-9: 2 handfuls nuts + cranberries Snack:  Beverages: 16 sprite zero, 32 ounces water  Estimated Energy Needs Calories: 1600   NUTRITION DIAGNOSIS  Overweight/obesity ([place]-3.3) related to past poor dietary habits and physical inactivity as evidenced by patient w/ planned sleeve gastrectomy surgery following dietary guidelines for continued weight loss.   NUTRITION INTERVENTION  Nutrition counseling (C-1) and education (E-2) to facilitate bariatric surgery  goals.  Pre-Op Goals Progress & New Goals continue: Aim for 4 day a week 40 minutes: stretch after: looking up hip flexor, lower back, quads,  hammies, and core continue: Have that dinner person make enough so you have lunch and dinner continue: Have breakfast continue: Add in one more bottle of water   Handouts Provided Include    Learning Style & Readiness for Change Teaching method utilized: Visual & Auditory  Demonstrated degree of understanding via: Teach Back  Readiness Level: action Barriers to learning/adherence to lifestyle change: none identified  RD's Notes for next Visit  Assess pts adherence to chosen goals   MONITORING & EVALUATION Dietary intake, weekly physical activity, body weight, and pre-op goals in 1 month.   Next Steps  Patient is to return to [facility] in one month

## 2021-11-04 ENCOUNTER — Encounter: Payer: 59 | Attending: Surgery | Admitting: Skilled Nursing Facility1

## 2021-11-04 ENCOUNTER — Other Ambulatory Visit: Payer: Self-pay

## 2021-11-04 DIAGNOSIS — E669 Obesity, unspecified: Secondary | ICD-10-CM | POA: Diagnosis not present

## 2021-11-04 NOTE — Progress Notes (Signed)
Supervised Weight Loss Visit ?Bariatric Nutrition Education ? ?Planned Surgery: sleeve gastrectomy  ? ?3 out of 6 [facility] Appointments  ? ?NUTRITION ASSESSMENT ? ?Anthropometrics  ?Start weight at [facility]: 262.8 lbs (date: 08/05/2021) ?Today's weight: 264.3 ?BMI: 42.64 kg/m2   ? ?Clinical  ?Medical hx: N/A ?Medications: vitamin D supplement,  ?Labs: vitamin D 15, A1C 6.7, cholesterol 216, LDL 131 ?Notable signs/symptoms: ocular migraines  ?Any previous deficiencies? Vitamin D, anemia ? ?Lifestyle & Dietary Hx ? ? ? ?Body Composition Scale 09/08/2021 10/07/2021 11/04/2021  ?Current Body Weight 263 260.6 264.2  ?Total Body Fat % 45.1 44.9 45.2  ?Visceral Fat '15 15 15  '$ ?Fat-Free Mass % 54.8 55 54.7  ? Total Body Water % 41.9 42 41.8  ?Muscle-Mass lbs 33.7 33.6 33.7  ?BMI 42.4 42 42.6  ?Body Fat Displacement     ?       Torso  lbs 73.6 72.6 74.1  ?       Left Leg  lbs 14.7 14.5 14.8  ?       Right Leg  lbs 14.7 14.5 14.8  ?       Left Arm  lbs 7.3 7.2 7.4  ?       Right Arm   lbs 7.3 7.2 7.4  ? ? ?Pt states her ankles have been causing her pain so she has not been able to workout like she wanted.  ?Pt state she does have swelling in her legs as well.  ? ?Estimated daily fluid intake: 67.6 oz ?Supplements:  ?Current average weekly physical activity: [place] 3 times a week 30 minutes ( not recently) ? ?24-Hr Dietary Recall: hired a woman to make her meals ?First Meal: toast + almond butter or smoothie or cereal or oatmeal or pancakes + sausage  ?Snack:  ?Second Meal 2pm:  salad:  walnuts, cucumbers, mixed greens, cucumber, chicken + balsamic or shrimp + mashed potatoes + broccoli or chopped cesar salad ?Snack: sometimes smoothie ?Third Meal 8:30-9: rice + stewed chicken + carrots + potatoes ?Snack:  ?Beverages: 16oz sprite zero, 32 ounces water ? ?Estimated Energy Needs ?Calories: 1600 ? ? ?NUTRITION DIAGNOSIS  ?Overweight/obesity (Rebecca-3.3) related to past poor dietary habits and physical inactivity as evidenced by patient w/  planned sleeve gastrectomy surgery following dietary guidelines for continued weight loss. ? ? ?NUTRITION INTERVENTION  ?Nutrition counseling (C-1) and education (E-2) to facilitate bariatric surgery goals. ? ?Pre-Op Goals Progress & New Goals ?continue: Aim for 4 day a week 40 minutes: stretch after: looking up hip flexor, lower back, quads,  hammies, and core ?continue: Have that dinner person make enough so you have lunch and dinner ?continue: Have breakfast ?continue: Add in one more bottle of water  ?NEW: try some upper body workouts since your ankles are in too much pain ?NEW: eat until satisfaction rather than full continue with samller portions ? ?Handouts Provided Include  ? ? ?Learning Style & Readiness for Change ?Teaching method utilized: Visual & Auditory  ?Demonstrated degree of understanding via: Teach Back  ?Readiness Level: action ?Barriers to learning/adherence to lifestyle change: none identified ? ?RD's Notes for next Visit  ?Assess pts adherence to chosen goals ? ? ?MONITORING & EVALUATION ?Dietary intake, weekly physical activity, body weight, and pre-op goals in 1 month.  ? ?Next Steps  ?Patient is to return to [facility] in one month ?

## 2021-12-02 ENCOUNTER — Ambulatory Visit: Payer: 59 | Admitting: Skilled Nursing Facility1

## 2021-12-06 ENCOUNTER — Encounter: Payer: 59 | Attending: Surgery | Admitting: Skilled Nursing Facility1

## 2021-12-06 DIAGNOSIS — Z6841 Body Mass Index (BMI) 40.0 and over, adult: Secondary | ICD-10-CM | POA: Diagnosis not present

## 2021-12-06 DIAGNOSIS — Z713 Dietary counseling and surveillance: Secondary | ICD-10-CM | POA: Diagnosis not present

## 2021-12-06 DIAGNOSIS — E669 Obesity, unspecified: Secondary | ICD-10-CM

## 2021-12-06 NOTE — Progress Notes (Signed)
Supervised Weight Loss Visit ?Bariatric Nutrition Education ? ?Planned Surgery: sleeve gastrectomy  ? ?4 out of 6 [facility] Appointments  ? ?NUTRITION ASSESSMENT ? ?Anthropometrics  ?Start weight at [facility]: 262.8 lbs (date: 08/05/2021) ?Today's weight: 268.9 ?BMI: 43.40 kg/m2   ? ?Clinical  ?Medical hx: N/A ?Medications: vitamin D supplement,  ?Labs: vitamin D 15, A1C 6.7, cholesterol 216, LDL 131 ?Notable signs/symptoms: ocular migraines  ?Any previous deficiencies? Vitamin D, anemia ? ?Lifestyle & Dietary Hx ? ? ? ?Body Composition Scale 09/08/2021 10/07/2021 11/04/2021  ?Current Body Weight 263 260.6 264.2  ?Total Body Fat % 45.1 44.9 45.2  ?Visceral Fat '15 15 15  '$ ?Fat-Free Mass % 54.8 55 54.7  ? Total Body Water % 41.9 42 41.8  ?Muscle-Mass lbs 33.7 33.6 33.7  ?BMI 42.4 42 42.6  ?Body Fat Displacement     ?       Torso  lbs 73.6 72.6 74.1  ?       Left Leg  lbs 14.7 14.5 14.8  ?       Right Leg  lbs 14.7 14.5 14.8  ?       Left Arm  lbs 7.3 7.2 7.4  ?       Right Arm   lbs 7.3 7.2 7.4  ? ? ?Pt states she just got from [place].  ?Pt states she is doing great with eating to satisfaction rather than fullness. ?Pt states she has been skipping breakfast. ? ?Estimated daily fluid intake: 67.6 oz ?Supplements:  ?Current average weekly physical activity: ADL's ? ?24-Hr Dietary Recall: hired a woman to make her meals ?First Meal: skipped ?Snack: nuts ?Second Meal 2pm:  chicken caesar salad ?Snack: sometimes smoothie ?Third Meal 8:30-9: shrimp and salad or chicken and veggie and onion  ?Snack:  ?Beverages: 16oz sprite zero, 32 ounces water ? ?Estimated Energy Needs ?Calories: 1600 ? ? ?NUTRITION DIAGNOSIS  ?Overweight/obesity (Turrell-3.3) related to past poor dietary habits and physical inactivity as evidenced by patient w/ planned sleeve gastrectomy surgery following dietary guidelines for continued weight loss. ? ? ?NUTRITION INTERVENTION  ?Nutrition counseling (C-1) and education (E-2) to facilitate bariatric surgery  goals. ? ?Pre-Op Goals Progress & New Goals ?continue: Aim for 4 day a week 40 minutes: stretch after: looking up hip flexor, lower back, quads,  hammies, and core ?continue: Have that dinner person make enough so you have lunch and dinner ?continue: Have breakfast ?continue: Add in one more bottle of water  ?Continue: try some upper body workouts since your ankles are in too much pain ?Continue: eat until satisfaction rather than full continue with samller portions ?NEW: get back to eating breakfast ?NEW: get back into your activity  ? ?Handouts Provided Include  ? ? ?Learning Style & Readiness for Change ?Teaching method utilized: Visual & Auditory  ?Demonstrated degree of understanding via: Teach Back  ?Readiness Level: action ?Barriers to learning/adherence to lifestyle change: none identified ? ?RD's Notes for next Visit  ?Assess pts adherence to chosen goals ? ? ?MONITORING & EVALUATION ?Dietary intake, weekly physical activity, body weight, and pre-op goals in 1 month.  ? ?Next Steps  ?Patient is to return to [facility] in one month ?

## 2022-01-05 ENCOUNTER — Ambulatory Visit: Payer: 59 | Admitting: Skilled Nursing Facility1

## 2022-01-10 ENCOUNTER — Encounter: Payer: Self-pay | Admitting: Urology

## 2022-01-10 ENCOUNTER — Ambulatory Visit: Payer: 59 | Admitting: Urology

## 2022-01-10 VITALS — BP 109/74 | HR 102

## 2022-01-10 DIAGNOSIS — N200 Calculus of kidney: Secondary | ICD-10-CM

## 2022-01-10 LAB — MICROSCOPIC EXAMINATION
RBC, Urine: >30 /hpf — AB (ref 0–2)
Renal Epithel, UA: NONE SEEN /hpf

## 2022-01-10 LAB — URINALYSIS, ROUTINE W REFLEX MICROSCOPIC
Bilirubin, UA: NEGATIVE
Glucose, UA: NEGATIVE
Nitrite, UA: NEGATIVE
Specific Gravity, UA: 1.025 (ref 1.005–1.030)
Urobilinogen, Ur: 0.2 mg/dL (ref 0.2–1.0)
pH, UA: 6.5 (ref 5.0–7.5)

## 2022-01-10 MED ORDER — MIRABEGRON ER 25 MG PO TB24: 25.0000 mg | ORAL_TABLET | Freq: Every day | ORAL | 0 refills | Status: DC

## 2022-01-10 NOTE — H&P (View-Only) (Signed)
01/10/2022 11:39 AM   Sallee Provencal 1980/11/23 [id number]  Referring provider: No referring provider defined for this encounter.  nephrolithiasis   HPI: Ms Michelle Holloway is a 41yo here for evaluation of nephrolithiasis. She was in [place] and had stone episode. She had a right ureteral stent placed 5/16 for a 7m right proximal calculus. This is her first stone event. 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 She has moderate right flank pain with the stent in place. She has urinary frequency and urgency with the stent. No other complaints today.   PMH: Past Medical History:  Diagnosis Date   Adenomyosis    Blood transfusion without reported diagnosis    Endometriosis    Fibroid    H/O total hysterectomy    Hyperlipidemia 08/06/2021   Infertility, female    LLQ pain 08/31/2020   Ovarian cyst    Pain of toe of left foot 08/31/2020    Surgical History: Past Surgical History:  Procedure Laterality Date   ABDOMINAL HYSTERECTOMY N/A 05/2019   DILATION AND CURETTAGE OF UTERUS      Home Medications:  Allergies as of 01/10/2022   No Known Allergies      Medication List        Accurate as of Jan 10, 2022 11:39 AM. If you have any questions, ask your nurse or doctor.          albuterol 108 (90 Base) MCG/ACT inhaler Commonly known as: VENTOLIN HFA Inhale 2 puffs into the lungs every 6 (six) hours as needed for wheezing or shortness of breath.   folic acid 1 MG tablet Commonly known as: FOLVITE Take 1 mg by mouth daily.   MULTIVITAMIN ADULT PO Take by mouth.   tamsulosin 0.4 MG Caps capsule Commonly known as: FLOMAX Take 0.4 mg by mouth every morning.   vitamin B-12 1000 MCG tablet Commonly known as: CYANOCOBALAMIN Take 1,000 mcg by mouth daily.   vitamin C 500 MG tablet Commonly known as: ASCORBIC ACID Take 500 mg by mouth daily.   VITAMIN D PO Take by mouth.        Allergies: No Known Allergies  Family History: Family History  Problem Relation  Age of Onset   Diabetes Mother    Hypertension Mother    Rheum arthritis Mother    Diabetes Father    Hypertension Father    Heart murmur Father    Diabetes Sister    Hypertension Sister    Rheum arthritis Sister    Diabetes Brother    Hypertension Brother    Diabetes Paternal Grandmother    Hypertension Paternal Grandmother    Brain cancer Maternal Uncle    Ovarian cancer Maternal Grandmother    Diabetes Maternal Grandmother    Hypertension Maternal Grandmother    Stroke Maternal Grandmother    Colon cancer Maternal Grandfather    Diabetes Maternal Grandfather    Hypertension Maternal Grandfather    Stroke Maternal Grandfather    Diabetes Paternal Grandfather    Hypertension Paternal Grandfather     Social History:  reports that she has never smoked. She has never used smokeless tobacco. She reports that she does not currently use alcohol. She reports that she does not use drugs.  ROS: All other review of systems were reviewed and are negative except what is noted above in HPI  Physical Exam: BP 109/74   Pulse (!) 102   LMP 08/30/2017 (Approximate)   Constitutional:  Alert and oriented, No acute distress. HEENT: [place]  AT, moist mucus membranes.  Trachea midline, no masses. Cardiovascular: No clubbing, cyanosis, or edema. Respiratory: Normal respiratory effort, no increased work of breathing. GI: Abdomen is soft, nontender, nondistended, no abdominal masses GU: No CVA tenderness.  Lymph: No cervical or inguinal lymphadenopathy. Skin: No rashes, bruises or suspicious lesions. Neurologic: Grossly intact, no focal deficits, moving all 4 extremities. Psychiatric: Normal mood and affect.  Laboratory Data: Lab Results  Component Value Date   WBC 7.5 08/05/2021   HGB 14.5 08/05/2021   HCT 44.1 08/05/2021   MCV 87.0 08/05/2021   PLT 279 08/05/2021    Lab Results  Component Value Date   CREATININE 0.86 08/05/2021    No results found for: PSA  No results found for:  TESTOSTERONE  Lab Results  Component Value Date   HGBA1C 6.7 (H) 08/05/2021    Urinalysis    Component Value Date/Time   COLORURINE YELLOW 09/09/2020 [place] 09/09/2020 0837   LABSPEC 1.028 09/09/2020 0837   PHURINE 6.0 09/09/2020 [place] 09/09/2020 0837   HGBUR NEGATIVE 09/09/2020 0837   [place] 09/09/2020 0837   PROTEINUR NEGATIVE 09/09/2020 0837   NITRITE NEGATIVE 09/09/2020 0837   LEUKOCYTESUR NEGATIVE 09/09/2020 0837    No results found for: LABMICR, WBCUA, RBCUA, LABEPIT, MUCUS, BACTERIA  Pertinent Imaging:  No results found for this or any previous visit.  No results found for this or any previous visit.  No results found for this or any previous visit.  No results found for this or any previous visit.  No results found for this or any previous visit.  No results found for this or any previous visit.  No results found for this or any previous visit.  No results found for this or any previous visit.   Assessment & Plan:    1. Kidney stones -We discussed the management of kidney stones. These options include observation, ureteroscopy, shockwave lithotripsy (ESWL) and percutaneous nephrolithotomy (PCNL). We discussed which options are relevant to the patient's stone(s). We discussed the natural history of kidney stones as well as the complications of untreated stones and the impact on quality of life without treatment as well as with each of the above listed treatments. We also discussed the efficacy of each treatment in its ability to clear the stone burden. With any of these management options I discussed the signs and symptoms of infection and the need for emergent treatment should these be experienced. For each option we discussed the ability of each procedure to clear the patient of their stone burden.   For observation I described the risks which include but are not limited to silent renal damage, life-threatening  infection, need for emergent surgery, failure to pass stone and pain.   For ureteroscopy I described the risks which include bleeding, infection, damage to contiguous structures, positioning injury, ureteral stricture, ureteral avulsion, ureteral injury, need for prolonged ureteral stent, inability to perform ureteroscopy, need for an interval procedure, inability to clear stone burden, stent discomfort/pain, heart attack, stroke, pulmonary embolus and the inherent risks with general anesthesia.   For shockwave lithotripsy I described the risks which include arrhythmia, kidney contusion, kidney hemorrhage, need for transfusion, pain, inability to adequately break up stone, inability to pass stone fragments, Steinstrasse, infection associated with obstructing stones, need for alternate surgical procedure, need for repeat shockwave lithotripsy, MI, CVA, PE and the inherent risks with anesthesia/conscious sedation.   For PCNL I described the risks including positioning injury, pneumothorax, hydrothorax, need  for chest tube, inability to clear stone burden, renal laceration, arterial venous fistula or malformation, need for embolization of kidney, loss of kidney or renal function, need for repeat procedure, need for prolonged nephrostomy tube, ureteral avulsion, MI, CVA, PE and the inherent risks of general anesthesia.   - The patient would like to proceed with right ureteroscopic stone extraction - Urinalysis, Routine w reflex microscopic   No follow-ups on file.  Nicolette Bang, MD  [facility] [place]

## 2022-01-10 NOTE — Patient Instructions (Signed)
Dietary Guidelines to Help Prevent Kidney Stones Kidney stones are deposits of minerals and salts that form inside your kidneys. Your risk of developing kidney stones may be greater depending on your diet, your lifestyle, the medicines you take, and whether you have certain medical conditions. Most people can lower their chances of developing kidney stones by following the instructions below. Your dietitian may give you more specific instructions depending on your overall health and the type of kidney stones you tend to develop. What are tips for following this plan? Reading food labels  Choose foods with "no salt added" or "low-salt" labels. Limit your salt (sodium) intake to less than 1,500 mg a day. Choose foods with calcium for each meal and snack. Try to eat about 300 mg of calcium at each meal. Foods that contain 200-500 mg of calcium a serving include: 8 oz (237 mL) of milk, calcium-fortifiednon-dairy milk, and calcium-fortifiedfruit juice. Calcium-fortified means that calcium has been added to these drinks. 8 oz (237 mL) of kefir, yogurt, and soy yogurt. 4 oz (114 g) of tofu. 1 oz (28 g) of cheese. 1 cup (150 g) of dried figs. 1 cup (91 g) of cooked broccoli. One 3 oz (85 g) can of sardines or mackerel. Most people need 1,000-1,500 mg of calcium a day. Talk to your dietitian about how much calcium is recommended for you. Shopping Buy plenty of fresh fruits and vegetables. Most people do not need to avoid fruits and vegetables, even if these foods contain nutrients that may contribute to kidney stones. When shopping for convenience foods, choose: Whole pieces of fruit. Pre-made salads with dressing on the side. Low-fat fruit and yogurt smoothies. Avoid buying frozen meals or prepared deli foods. These can be high in sodium. Look for foods with live cultures, such as yogurt and kefir. Choose high-fiber grains, such as whole-wheat breads, oat bran, and wheat cereals. Cooking Do not add  salt to food when cooking. Place a salt shaker on the table and allow each person to add his or her own salt to taste. Use vegetable protein, such as beans, textured vegetable protein (TVP), or tofu, instead of meat in pasta, casseroles, and soups. Meal planning Eat less salt, if told by your dietitian. To do this: Avoid eating processed or pre-made food. Avoid eating fast food. Eat less animal protein, including cheese, meat, poultry, or fish, if told by your dietitian. To do this: Limit the number of times you have meat, poultry, fish, or cheese each week. Eat a diet free of meat at least 2 days a week. Eat only one serving each day of meat, poultry, fish, or seafood. When you prepare animal protein, cut pieces into small portion sizes. For most meat and fish, one serving is about the size of the palm of your hand. Eat at least five servings of fresh fruits and vegetables each day. To do this: Keep fruits and vegetables on hand for snacks. Eat one piece of fruit or a handful of berries with breakfast. Have a salad and fruit at lunch. Have two kinds of vegetables at dinner. Limit foods that are high in a substance called oxalate. These include: Spinach (cooked), rhubarb, beets, sweet potatoes, and Swiss chard. Peanuts. Potato chips, french fries, and baked potatoes with skin on. Nuts and nut products. Chocolate. If you regularly take a diuretic medicine, make sure to eat at least 1 or 2 servings of fruits or vegetables that are high in potassium each day. These include: Avocado. Banana. Orange, prune,   carrot, or tomato juice. Baked potato. Cabbage. Beans and split peas. Lifestyle  Drink enough fluid to keep your urine pale yellow. This is the most important thing you can do. Spread your fluid intake throughout the day. If you drink alcohol: Limit how much you use to: 0-1 drink a day for women who are not pregnant. 0-2 drinks a day for men. Be aware of how much alcohol is in your  drink. In the [place] one drink equals one 12 oz bottle of beer (355 mL), one 5 oz glass of wine (148 mL), or one 1 oz glass of hard liquor (44 mL). Lose weight if told by your health care provider. Work with your dietitian to find an eating plan and weight loss strategies that work best for you. General information Talk to your health care provider and dietitian about taking daily supplements. You may be told the following depending on your health and the cause of your kidney stones: Not to take supplements with vitamin C. To take a calcium supplement. To take a daily probiotic supplement. To take other supplements such as magnesium, fish oil, or vitamin B6. Take over-the-counter and prescription medicines only as told by your health care provider. These include supplements. What foods should I limit? Limit your intake of the following foods, or eat them as told by your dietitian. Vegetables Spinach. Rhubarb. Beets. Canned vegetables. Pickles. Olives. Baked potatoes with skin. Grains Wheat bran. Baked goods. Salted crackers. Cereals high in sugar. Meats and other proteins Nuts. Nut butters. Large portions of meat, poultry, or fish. Salted, precooked, or cured meats, such as sausages, meat loaves, and hot dogs. Dairy Cheese. Beverages Regular soft drinks. Regular vegetable juice. Seasonings and condiments Seasoning blends with salt. Salad dressings. Soy sauce. Ketchup. Barbecue sauce. Other foods Canned soups. Canned pasta sauce. Casseroles. Pizza. Lasagna. Frozen meals. Potato chips. French fries. The items listed above may not be a complete list of foods and beverages you should limit. Contact a dietitian for more information. What foods should I avoid? Talk to your dietitian about specific foods you should avoid based on the type of kidney stones you have and your overall health. Fruits Grapefruit. The item listed above may not be a complete list of foods and beverages you should  avoid. Contact a dietitian for more information. Summary Kidney stones are deposits of minerals and salts that form inside your kidneys. You can lower your risk of kidney stones by making changes to your diet. The most important thing you can do is drink enough fluid. Drink enough fluid to keep your urine pale yellow. Talk to your dietitian about how much calcium you should have each day, and eat less salt and animal protein as told by your dietitian. This information is not intended to replace advice given to you by your health care provider. Make sure you discuss any questions you have with your health care provider. Document Revised: 04/19/2021 Document Reviewed: 04/19/2021 Elsevier Patient Education  2023 [employer]  

## 2022-01-10 NOTE — Progress Notes (Signed)
01/10/2022 11:39 AM   Sallee Provencal 05/04/1981 [id number]  Referring provider: No referring provider defined for this encounter.  nephrolithiasis   HPI: Ms Michelle Holloway is a 41yo here for evaluation of nephrolithiasis. She was in [place] and had stone episode. She had a right ureteral stent placed 5/16 for a 19m right proximal calculus. This is her first stone event. 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 She has moderate right flank pain with the stent in place. She has urinary frequency and urgency with the stent. No other complaints today.   PMH: Past Medical History:  Diagnosis Date   Adenomyosis    Blood transfusion without reported diagnosis    Endometriosis    Fibroid    H/O total hysterectomy    Hyperlipidemia 08/06/2021   Infertility, female    LLQ pain 08/31/2020   Ovarian cyst    Pain of toe of left foot 08/31/2020    Surgical History: Past Surgical History:  Procedure Laterality Date   ABDOMINAL HYSTERECTOMY N/A 05/2019   DILATION AND CURETTAGE OF UTERUS      Home Medications:  Allergies as of 01/10/2022   No Known Allergies      Medication List        Accurate as of Jan 10, 2022 11:39 AM. If you have any questions, ask your nurse or doctor.          albuterol 108 (90 Base) MCG/ACT inhaler Commonly known as: VENTOLIN HFA Inhale 2 puffs into the lungs every 6 (six) hours as needed for wheezing or shortness of breath.   folic acid 1 MG tablet Commonly known as: FOLVITE Take 1 mg by mouth daily.   MULTIVITAMIN ADULT PO Take by mouth.   tamsulosin 0.4 MG Caps capsule Commonly known as: FLOMAX Take 0.4 mg by mouth every morning.   vitamin B-12 1000 MCG tablet Commonly known as: CYANOCOBALAMIN Take 1,000 mcg by mouth daily.   vitamin C 500 MG tablet Commonly known as: ASCORBIC ACID Take 500 mg by mouth daily.   VITAMIN D PO Take by mouth.        Allergies: No Known Allergies  Family History: Family History  Problem Relation  Age of Onset   Diabetes Mother    Hypertension Mother    Rheum arthritis Mother    Diabetes Father    Hypertension Father    Heart murmur Father    Diabetes Sister    Hypertension Sister    Rheum arthritis Sister    Diabetes Brother    Hypertension Brother    Diabetes Paternal Grandmother    Hypertension Paternal Grandmother    Brain cancer Maternal Uncle    Ovarian cancer Maternal Grandmother    Diabetes Maternal Grandmother    Hypertension Maternal Grandmother    Stroke Maternal Grandmother    Colon cancer Maternal Grandfather    Diabetes Maternal Grandfather    Hypertension Maternal Grandfather    Stroke Maternal Grandfather    Diabetes Paternal Grandfather    Hypertension Paternal Grandfather     Social History:  reports that she has never smoked. She has never used smokeless tobacco. She reports that she does not currently use alcohol. She reports that she does not use drugs.  ROS: All other review of systems were reviewed and are negative except what is noted above in HPI  Physical Exam: BP 109/74   Pulse (!) 102   LMP 08/30/2017 (Approximate)   Constitutional:  Alert and oriented, No acute distress. HEENT: [place]  AT, moist mucus membranes.  Trachea midline, no masses. Cardiovascular: No clubbing, cyanosis, or edema. Respiratory: Normal respiratory effort, no increased work of breathing. GI: Abdomen is soft, nontender, nondistended, no abdominal masses GU: No CVA tenderness.  Lymph: No cervical or inguinal lymphadenopathy. Skin: No rashes, bruises or suspicious lesions. Neurologic: Grossly intact, no focal deficits, moving all 4 extremities. Psychiatric: Normal mood and affect.  Laboratory Data: Lab Results  Component Value Date   WBC 7.5 08/05/2021   HGB 14.5 08/05/2021   HCT 44.1 08/05/2021   MCV 87.0 08/05/2021   PLT 279 08/05/2021    Lab Results  Component Value Date   CREATININE 0.86 08/05/2021    No results found for: PSA  No results found for:  TESTOSTERONE  Lab Results  Component Value Date   HGBA1C 6.7 (H) 08/05/2021    Urinalysis    Component Value Date/Time   COLORURINE YELLOW 09/09/2020 [place] 09/09/2020 0837   LABSPEC 1.028 09/09/2020 0837   PHURINE 6.0 09/09/2020 [place] 09/09/2020 0837   HGBUR NEGATIVE 09/09/2020 0837   [place] 09/09/2020 0837   PROTEINUR NEGATIVE 09/09/2020 0837   NITRITE NEGATIVE 09/09/2020 0837   LEUKOCYTESUR NEGATIVE 09/09/2020 0837    No results found for: LABMICR, WBCUA, RBCUA, LABEPIT, MUCUS, BACTERIA  Pertinent Imaging:  No results found for this or any previous visit.  No results found for this or any previous visit.  No results found for this or any previous visit.  No results found for this or any previous visit.  No results found for this or any previous visit.  No results found for this or any previous visit.  No results found for this or any previous visit.  No results found for this or any previous visit.   Assessment & Plan:    1. Kidney stones -We discussed the management of kidney stones. These options include observation, ureteroscopy, shockwave lithotripsy (ESWL) and percutaneous nephrolithotomy (PCNL). We discussed which options are relevant to the patient's stone(s). We discussed the natural history of kidney stones as well as the complications of untreated stones and the impact on quality of life without treatment as well as with each of the above listed treatments. We also discussed the efficacy of each treatment in its ability to clear the stone burden. With any of these management options I discussed the signs and symptoms of infection and the need for emergent treatment should these be experienced. For each option we discussed the ability of each procedure to clear the patient of their stone burden.   For observation I described the risks which include but are not limited to silent renal damage, life-threatening  infection, need for emergent surgery, failure to pass stone and pain.   For ureteroscopy I described the risks which include bleeding, infection, damage to contiguous structures, positioning injury, ureteral stricture, ureteral avulsion, ureteral injury, need for prolonged ureteral stent, inability to perform ureteroscopy, need for an interval procedure, inability to clear stone burden, stent discomfort/pain, heart attack, stroke, pulmonary embolus and the inherent risks with general anesthesia.   For shockwave lithotripsy I described the risks which include arrhythmia, kidney contusion, kidney hemorrhage, need for transfusion, pain, inability to adequately break up stone, inability to pass stone fragments, Steinstrasse, infection associated with obstructing stones, need for alternate surgical procedure, need for repeat shockwave lithotripsy, MI, CVA, PE and the inherent risks with anesthesia/conscious sedation.   For PCNL I described the risks including positioning injury, pneumothorax, hydrothorax, need  for chest tube, inability to clear stone burden, renal laceration, arterial venous fistula or malformation, need for embolization of kidney, loss of kidney or renal function, need for repeat procedure, need for prolonged nephrostomy tube, ureteral avulsion, MI, CVA, PE and the inherent risks of general anesthesia.   - The patient would like to proceed with right ureteroscopic stone extraction - Urinalysis, Routine w reflex microscopic   No follow-ups on file.  Nicolette Bang, MD  [facility] [place]

## 2022-01-12 NOTE — Patient Instructions (Addendum)
Michelle Holloway  01/12/2022     '@PREFPERIOPPHARMACY'$ @   Your procedure is scheduled on 01/20/2022.  Report to [facility] at 9:30 A.M.  Call this number if you have problems the morning of surgery:  [phone]   Remember:  Do not eat or drink after midnight.    Take these medicines the morning of surgery with A SIP OF WATER : Myrbetriq and Flomax.  Please use your Albuterol Inhaler before coming to the hospital.     Do not wear jewelry, make-up or nail polish.  Do not wear lotions, powders, or perfumes, or deodorant.  Do not shave 48 hours prior to surgery.  Men may shave face and neck.  Do not bring valuables to the hospital.  [facility] is not responsible for any belongings or valuables.  Contacts, dentures or bridgework may not be worn into surgery.  Leave your suitcase in the car.  After surgery it may be brought to your room.  For patients admitted to the hospital, discharge time will be determined by your treatment team.  Patients discharged the day of surgery will not be allowed to drive home.   Name and phone number of your driver:   Family Special instructions:  N/A  Please read over the following fact sheets that you were given. Care and Recovery After Surgery  Ureteroscopy Ureteroscopy is a procedure to check for and treat problems inside part of the urinary tract. In this procedure, a thin, flexible tube with a light at the end (ureteroscope) is used to look at the inside of the kidneys and the ureters. The ureters are the tubes that carry urine from the kidneys to the bladder. The ureteroscope is inserted into one or both of the ureters. You may need this procedure if you have frequent urinary tract infections (UTIs), blood in your urine, or a stone in one of your ureters. A ureteroscopy can be done: To find the cause of urine blockage in a ureter and to evaluate other abnormalities inside the ureters or kidneys. To remove stones. To remove or treat growths of  tissue (polyps), abnormal tissue, and some types of tumors. To remove a tissue sample and check it for disease under a microscope (biopsy). Tell a health care provider about: Any allergies you have. All medicines you are taking, including vitamins, herbs, eye drops, creams, and over-the-counter medicines. Any problems you or family members have had with anesthetic medicines. Any blood disorders you have. Any surgeries you have had. Any medical conditions you have. Whether you are pregnant or may be pregnant. What are the risks? Generally, this is a safe procedure. However, problems may occur, including: Bleeding. Infection. Allergic reactions to medicines. Scarring that narrows the ureter (stricture). Creating a hole in the ureter (perforation). What happens before the procedure? Staying hydrated Follow instructions from your health care provider about hydration, which may include: Up to 2 hours before the procedure - you may continue to drink clear liquids, such as water, clear fruit juice, black coffee, and plain tea.  Eating and drinking restrictions Follow instructions from your health care provider about eating and drinking, which may include: 8 hours before the procedure - stop eating heavy meals or foods, such as meat, fried foods, or fatty foods. 6 hours before the procedure - stop eating light meals or foods, such as toast or cereal. 6 hours before the procedure - stop drinking milk or drinks that contain milk. 2 hours before the procedure - stop drinking clear liquids.  Medicines Ask your health care provider about: Changing or stopping your regular medicines. This is especially important if you are taking diabetes medicines or blood thinners. Taking medicines such as aspirin and ibuprofen. These medicines can thin your blood. Do not take these medicines unless your health care provider tells you to take them. Taking over-the-counter medicines, vitamins, herbs, and  supplements. General instructions Do not use any products that contain nicotine or tobacco for at least 4 weeks before the procedure. These products include cigarettes, e-cigarettes, and chewing tobacco. If you need help quitting, ask your health care provider. You may have a urine sample taken to check for infection. Plan to have someone take you home from the hospital or clinic. If you will be going home right after the procedure, plan to have someone with you for 24 hours. Ask your health care provider what steps will be taken to help prevent infection. These may include: Washing skin with a germ-killing soap. Receiving antibiotic medicine. What happens during the procedure?  An IV will be inserted into one of your veins. You will be given one or more of the following: A medicine to help you relax (sedative). A medicine to make you fall asleep (general anesthetic). A medicine that is injected into your spine to numb the area below and slightly above the injection site (spinal anesthetic). The part of your body that drains urine from your bladder (urethra) will be cleaned with a germ-killing solution. The ureteroscope will be passed through your urethra into your bladder. A salt-water solution will be sent through the ureteroscope to fill your bladder. This will help the health care provider see the openings of your ureters more clearly. The ureteroscope will be passed into your ureter. If a growth is found, a biopsy may be done. If a stone is found, it may be removed through the ureteroscope, or the stone may be broken up using a laser, shock waves, or electrical energy. In some cases, if the ureter is too small, a tube may be inserted that keeps the ureter open (ureteral stent). The stent may be left in place for 1 or 2 weeks to keep the ureter open, and then the ureteroscopy procedure will be done. The scope will be removed, and your bladder will be emptied. The procedure may vary among  health care providers and hospitals. What can I expect after the procedure? After your procedure, it is common to have: Your blood pressure, heart rate, breathing rate, and blood oxygen level monitored until you leave the hospital or clinic. A burning sensation when you urinate. You may be asked to urinate. Blood in your urine. Mild discomfort in your bladder area or kidney area when urinating. A need to urinate more often or urgently. Follow these instructions at home: Medicines Take over-the-counter and prescription medicines only as told by your health care provider. If you were prescribed an antibiotic medicine, take it as told by your health care provider. Do not stop taking the antibiotic even if you start to feel better. General instructions  If you were given a sedative during the procedure, it can affect you for several hours. Do not drive or operate machinery until your health care provider says that it is safe. To relieve burning, take a warm bath or hold a warm washcloth over your groin. Drink enough fluid to keep your urine pale yellow. Drink two 8-ounce (237 mL) glasses of water every hour for the first 2 hours after you get home. Continue to drink  water often at home. You can eat what you normally do. Keep all follow-up visits as told by your health care provider. This is important. If you had a ureteral stent placed, ask your health care provider when you need to return to have it removed. Contact a health care provider if you have: Chills or a fever. Burning pain for longer than 24 hours after the procedure. Blood in your urine for longer than 24 hours after the procedure. Get help right away if you have: Large amounts of blood in your urine. Blood clots in your urine. Severe pain. Chest pain or trouble breathing. The feeling of a full bladder and you are unable to urinate. These symptoms may represent a serious problem that is an emergency. Do not wait to see if the  symptoms will go away. Get medical help right away. Call your local emergency services (911 in the [place] Summary Ureteroscopy is a procedure to check for and treat problems inside part of the urinary tract. In this procedure, a thin, flexible tube with a light at the end (ureteroscope) is used to look at the inside of the kidneys and the ureters. You may need this procedure if you have frequent urinary tract infections (UTIs), blood in your urine, or a stone in a ureter. This information is not intended to replace advice given to you by your health care provider. Make sure you discuss any questions you have with your health care provider. Document Revised: 07/08/2021 Document Reviewed: 05/15/2019 Elsevier Patient Education  [place]. Cystoscopy Cystoscopy is a procedure that is used to help diagnose and sometimes treat conditions that affect the lower urinary tract. The lower urinary tract includes the bladder and the urethra. The urethra is the tube that drains urine from the bladder. Cystoscopy is done using a thin, tube-shaped instrument with a light and camera at the end (cystoscope). The cystoscope may be hard or flexible, depending on the goal of the procedure. The cystoscope is inserted through the urethra, into the bladder. Cystoscopy may be recommended if you have: Urinary tract infections that keep coming back. Blood in the urine (hematuria). An inability to control when you urinate (urinary incontinence) or an overactive bladder. Unusual cells found in a urine sample. A blockage in the urethra, such as a urinary stone. Painful urination. An abnormality in the bladder found during an intravenous pyelogram (IVP) or CT scan. Cystoscopy may also be done to remove a sample of tissue to be examined under a microscope (biopsy). Tell a health care provider about: Any allergies you have. All medicines you are taking, including vitamins, herbs, eye drops, creams, and over-the-counter  medicines. Any problems you or family members have had with anesthetic medicines. Any blood disorders you have. Any surgeries you have had. Any medical conditions you have. Whether you are pregnant or may be pregnant. What are the risks? Generally, this is a safe procedure. However, problems may occur, including: Infection. Bleeding. Allergic reactions to medicines. Damage to other structures or organs. What happens before the procedure? Medicines Ask your health care provider about: Changing or stopping your regular medicines. This is especially important if you are taking diabetes medicines or blood thinners. Taking medicines such as aspirin and ibuprofen. These medicines can thin your blood. Do not take these medicines unless your health care provider tells you to take them. Taking over-the-counter medicines, vitamins, herbs, and supplements. Tests You may have an exam or testing, such as: X-rays of the bladder, urethra, or kidneys. CT  scan of the abdomen or pelvis. Urine tests to check for signs of infection. General instructions Follow instructions from your health care provider about eating or drinking restrictions. Ask your health care provider what steps will be taken to help prevent infection. These steps may include: Washing skin with a germ-killing soap. Taking antibiotic medicine. Plan to have a responsible adult take you home from the hospital or clinic. What happens during the procedure?  You will be given one or more of the following: A medicine to help you relax (sedative). A medicine to numb the area (local anesthetic). The area around the opening of your urethra will be cleaned. The cystoscope will be passed through your urethra into your bladder. Germ-free (sterile) fluid will flow through the cystoscope to fill your bladder. The fluid will stretch your bladder so that your health care provider can clearly examine your bladder walls. Your doctor will look at  the urethra and bladder. Your doctor may take a biopsy or remove stones. The cystoscope will be removed, and your bladder will be emptied. The procedure may vary among health care providers and hospitals. What can I expect after the procedure? After the procedure, it is common to have: Some soreness or pain in your abdomen and urethra. Urinary symptoms. These include: Mild pain or burning when you urinate. Pain should stop within a few minutes after you urinate. This may last for up to 1 week. A small amount of blood in your urine for several days. Feeling like you need to urinate but producing only a small amount of urine. Follow these instructions at home: Medicines Take over-the-counter and prescription medicines only as told by your health care provider. If you were prescribed an antibiotic medicine, take it as told by your health care provider. Do not stop taking the antibiotic even if you start to feel better. General instructions Return to your normal activities as told by your health care provider. Ask your health care provider what activities are safe for you. If you were given a sedative during the procedure, it can affect you for several hours. Do not drive or operate machinery until your health care provider says that it is safe. Watch for any blood in your urine. If the amount of blood in your urine increases, call your health care provider. Follow instructions from your health care provider about eating or drinking restrictions. If a tissue sample was removed for testing (biopsy) during your procedure, it is up to you to get your test results. Ask your health care provider, or the department that is doing the test, when your results will be ready. Drink enough fluid to keep your urine pale yellow. Keep all follow-up visits. This is important. Contact a health care provider if: You have pain that gets worse or does not get better with medicine, especially pain when you  urinate. You have trouble urinating. You have more blood in your urine. Get help right away if: You have blood clots in your urine. You have abdominal pain. You have a fever or chills. You are unable to urinate. Summary Cystoscopy is a procedure that is used to help diagnose and sometimes treat conditions that affect the lower urinary tract. Cystoscopy is done using a thin, tube-shaped instrument with a light and camera at the end. After the procedure, it is common to have some soreness or pain in your abdomen and urethra. Watch for any blood in your urine. If the amount of blood in your urine increases, call your  health care provider. If you were prescribed an antibiotic medicine, take it as told by your health care provider. Do not stop taking the antibiotic even if you start to feel better. This information is not intended to replace advice given to you by your health care provider. Make sure you discuss any questions you have with your health care provider. Document Revised: 04/21/2021 Document Reviewed: 03/20/2020 Elsevier Patient Education  [place] Anesthesia, Adult General anesthesia is the use of medicines to make a person "go to sleep" (unconscious) for a medical procedure. General anesthesia must be used for certain procedures, and is often recommended for procedures that: Last a long time. Require you to be still or in an unusual position. Are major and can cause blood loss. The medicines used for general anesthesia are called general anesthetics. As well as making you unconscious for a certain amount of time, these medicines: Prevent pain. Control your blood pressure. Relax your muscles. Tell a health care provider about: Any allergies you have. All medicines you are taking, including vitamins, herbs, eye drops, creams, and over-the-counter medicines. Any problems you or family members have had with anesthetic medicines. Types of anesthetics you have  had in the past. Any blood disorders you have. Any surgeries you have had. Any medical conditions you have. Any recent upper respiratory, chest, or ear infections. Any history of: Heart or lung conditions, such as heart failure, sleep apnea, asthma, or chronic obstructive pulmonary disease (COPD). [employer]/support/administrative officer. Depression or anxiety. Any tobacco or drug use, including marijuana or alcohol use. Whether you are pregnant or may be pregnant. What are the risks? Generally, this is a safe procedure. However, problems may occur, including: Allergic reaction. Lung and heart problems. Inhaling food or liquid from the stomach into the lungs (aspiration). Nerve injury. Dental injury. Air in the bloodstream, which can lead to stroke. Extreme agitation or confusion (delirium) when you wake up from the anesthetic. Waking up during your procedure and being unable to move. This is rare. These problems are more likely to develop if you are having a major surgery or if you have an advanced or serious medical condition. You can prevent some of these complications by answering all of your health care provider's questions thoroughly and by following all instructions before your procedure. General anesthesia can cause side effects, including: Nausea or vomiting. A sore throat from the breathing tube. Hoarseness. Wheezing or coughing. Shaking chills. Tiredness. Body aches. Anxiety. Sleepiness or drowsiness. Confusion or agitation. What happens before the procedure? Staying hydrated Follow instructions from your health care provider about hydration, which may include: Up to 2 hours before the procedure - you may continue to drink clear liquids, such as water, clear fruit juice, black coffee, and plain tea.  Eating and drinking restrictions Follow instructions from your health care provider about eating and drinking, which may include: 8 hours before the procedure - stop eating heavy meals or  foods such as meat, fried foods, or fatty foods. 6 hours before the procedure - stop eating light meals or foods, such as toast or cereal. 6 hours before the procedure - stop drinking milk or drinks that contain milk. 2 hours before the procedure - stop drinking clear liquids. Medicines Ask your health care provider about: Changing or stopping your regular medicines. This is especially important if you are taking diabetes medicines or blood thinners. Taking medicines such as aspirin and ibuprofen. These medicines can thin your blood. Do not take these medicines unless  your health care provider tells you to take them. Taking over-the-counter medicines, vitamins, herbs, and supplements. Do not take these during the week before your procedure unless your health care provider approves them. General instructions Starting 3-6 weeks before the procedure, do not use any products that contain nicotine or tobacco, such as cigarettes and e-cigarettes. If you need help quitting, ask your health care provider. If you brush your teeth on the morning of the procedure, make sure to spit out all of the toothpaste. Tell your health care provider if you become ill or develop a cold, cough, or fever. If instructed by your health care provider, bring your sleep apnea device with you on the day of your surgery (if applicable). Ask your health care provider if you will be going home the same day, the following day, or after a longer hospital stay. Plan to have a responsible adult take you home from the hospital or clinic. Plan to have a responsible adult care for you for the time you are told after you leave the hospital or clinic. This is important. What happens during the procedure?  You will be given anesthetics through both of the following: A mask placed over your nose and mouth. An IV in one of your veins. You may receive a medicine to help you relax (sedative). After you are unconscious, a breathing tube may  be inserted down your throat to help you breathe. This will be removed before you wake up. An anesthesia specialist will stay with you throughout your procedure. He or she will: Keep you comfortable and safe by continuing to give you medicines and adjusting the amount of medicine that you get. Monitor your blood pressure, pulse, and oxygen levels to make sure that the anesthetics do not cause any problems. The procedure may vary among health care providers and hospitals. What happens after the procedure? Your blood pressure, temperature, heart rate, breathing rate, and blood oxygen level will be monitored until the medicines you were given have worn off. You will wake up in a recovery area. You may wake up slowly. If you feel anxious or agitated, you may be given medicine to help you calm down. If you will be going home the same day, your health care provider may check to make sure you can walk, drink, and urinate. Your health care provider will treat any pain or side effects you have before you go home. Do not drive or operate machinery until your health care provider says that it is safe. Summary General anesthesia is used to keep you still and prevent pain during a procedure. It is important to tell your health care provider about your medical history and any surgeries you have had, and previous experience with anesthesia. Follow your health care provider's instructions about when to stop eating, drinking, or taking certain medicines before your procedure. Plan to have a responsible adult take you home from the hospital or clinic. This information is not intended to replace advice given to you by your health care provider. Make sure you discuss any questions you have with your health care provider. Document Revised: 04/20/2020 Document Reviewed: 11/20/2019 Elsevier Patient Education  [place].

## 2022-01-13 ENCOUNTER — Encounter (HOSPITAL_COMMUNITY)
Admission: RE | Admit: 2022-01-13 | Discharge: 2022-01-13 | Disposition: A | Discharge status: Home / Self Care | Payer: 59 | Source: Ambulatory Visit | Attending: Urology | Admitting: Urology

## 2022-01-20 ENCOUNTER — Ambulatory Visit (HOSPITAL_COMMUNITY)
Admission: RE | Admit: 2022-01-20 | Discharge: 2022-01-20 | Disposition: A | Discharge status: Home / Self Care | Payer: 59 | Source: Ambulatory Visit | Attending: Urology | Admitting: Urology

## 2022-01-20 ENCOUNTER — Ambulatory Visit (HOSPITAL_BASED_OUTPATIENT_CLINIC_OR_DEPARTMENT_OTHER): Payer: 59 | Admitting: Registered Nurse

## 2022-01-20 ENCOUNTER — Encounter (HOSPITAL_COMMUNITY): Payer: Self-pay | Admitting: Urology

## 2022-01-20 ENCOUNTER — Ambulatory Visit (HOSPITAL_COMMUNITY): Payer: 59

## 2022-01-20 ENCOUNTER — Ambulatory Visit (HOSPITAL_COMMUNITY): Payer: 59 | Admitting: Registered Nurse

## 2022-01-20 ENCOUNTER — Encounter (HOSPITAL_COMMUNITY)
Admission: RE | Disposition: A | Discharge status: Home / Self Care | Payer: Self-pay | Source: Ambulatory Visit | Attending: Urology

## 2022-01-20 DIAGNOSIS — N201 Calculus of ureter: Secondary | ICD-10-CM | POA: Diagnosis not present

## 2022-01-20 DIAGNOSIS — Z9884 Bariatric surgery status: Secondary | ICD-10-CM | POA: Diagnosis not present

## 2022-01-20 HISTORY — PX: STONE EXTRACTION WITH BASKET: SHX5318

## 2022-01-20 HISTORY — PX: CYSTOSCOPY WITH RETROGRADE PYELOGRAM, URETEROSCOPY AND STENT PLACEMENT: SHX5789

## 2022-01-20 SURGERY — CYSTOURETEROSCOPY, WITH RETROGRADE PYELOGRAM AND STENT INSERTION
Anesthesia: General | Site: Ureter | Laterality: Right

## 2022-01-20 MED ORDER — LIDOCAINE HCL URETHRAL/MUCOSAL 2 % EX GEL
CUTANEOUS | Status: DC | PRN
  Administered 2022-01-20: 60 via TOPICAL

## 2022-01-20 MED ORDER — OXYCODONE-ACETAMINOPHEN 5-325 MG PO TABS: 1.0000 | ORAL_TABLET | ORAL | 0 refills | Status: DC | PRN

## 2022-01-20 MED ORDER — FENTANYL CITRATE PF 50 MCG/ML IJ SOSY: 25.0000 ug | PREFILLED_SYRINGE | INTRAMUSCULAR | Status: DC | PRN

## 2022-01-20 MED ORDER — FENTANYL CITRATE (PF) 100 MCG/2ML IJ SOLN
INTRAMUSCULAR | Status: DC | PRN
  Administered 2022-01-20: 100 ug via INTRAVENOUS

## 2022-01-20 MED ORDER — DEXAMETHASONE SODIUM PHOSPHATE 4 MG/ML IJ SOLN
INTRAMUSCULAR | Status: DC | PRN
  Administered 2022-01-20: 5 mg via INTRAVENOUS

## 2022-01-20 MED ORDER — LACTATED RINGERS IV SOLN: INTRAVENOUS | Status: DC | PRN

## 2022-01-20 MED ORDER — SODIUM CHLORIDE 0.9 % IV SOLN
INTRAVENOUS | Status: AC
  Filled 2022-01-20: qty 20

## 2022-01-20 MED ORDER — DIATRIZOATE MEGLUMINE 30 % UR SOLN
URETHRAL | Status: AC
  Filled 2022-01-20: qty 100

## 2022-01-20 MED ORDER — DIATRIZOATE MEGLUMINE 30 % UR SOLN
URETHRAL | Status: DC | PRN
  Administered 2022-01-20: 8 mL via URETHRAL

## 2022-01-20 MED ORDER — LACTATED RINGERS IV SOLN
INTRAVENOUS | Status: DC
Start: 2022-01-20 — End: 2022-01-20

## 2022-01-20 MED ORDER — ONDANSETRON HCL 4 MG/2ML IJ SOLN: 4.0000 mg | Freq: Once | INTRAMUSCULAR | Status: DC | PRN

## 2022-01-20 MED ORDER — CHLORHEXIDINE GLUCONATE 0.12 % MT SOLN: 15.0000 mL | Freq: Once | OROMUCOSAL | Status: DC

## 2022-01-20 MED ORDER — ONDANSETRON HCL 4 MG/2ML IJ SOLN
INTRAMUSCULAR | Status: DC | PRN
  Administered 2022-01-20: 4 mg via INTRAVENOUS

## 2022-01-20 MED ORDER — FENTANYL CITRATE (PF) 100 MCG/2ML IJ SOLN
INTRAMUSCULAR | Status: AC
  Filled 2022-01-20: qty 2

## 2022-01-20 MED ORDER — ORAL CARE MOUTH RINSE: 15.0000 mL | Freq: Once | OROMUCOSAL | Status: DC

## 2022-01-20 MED ORDER — MIDAZOLAM HCL 5 MG/5ML IJ SOLN
INTRAMUSCULAR | Status: DC | PRN
  Administered 2022-01-20: 2 mg via INTRAVENOUS

## 2022-01-20 MED ORDER — PROPOFOL 10 MG/ML IV BOLUS
INTRAVENOUS | Status: DC | PRN
  Administered 2022-01-20: 200 mg via INTRAVENOUS

## 2022-01-20 MED ORDER — WATER FOR IRRIGATION, STERILE IR SOLN
Status: DC | PRN
  Administered 2022-01-20: 500 mL

## 2022-01-20 MED ORDER — SODIUM CHLORIDE 0.9 % IV SOLN
2.0000 g | INTRAVENOUS | Status: AC
  Administered 2022-01-20: 2 g via INTRAVENOUS

## 2022-01-20 MED ORDER — MIDAZOLAM HCL 2 MG/2ML IJ SOLN
INTRAMUSCULAR | Status: AC
  Filled 2022-01-20: qty 2

## 2022-01-20 MED ORDER — SODIUM CHLORIDE 0.9 % IR SOLN
Status: DC | PRN
  Administered 2022-01-20 (×2): 3000 mL

## 2022-01-20 SURGICAL SUPPLY — 27 items
BAG DRAIN URO TABLE W/ADPT NS (BAG) ×3 IMPLANT
BAG DRN 8 ADPR NS SKTRN CSTL (BAG) ×2
BAG HAMPER (MISCELLANEOUS) ×3 IMPLANT
CATH INTERMIT  6FR 70CM (CATHETERS) ×3 IMPLANT
CLOTH BEACON ORANGE TIMEOUT ST (SAFETY) ×3 IMPLANT
DECANTER SPIKE VIAL GLASS SM (MISCELLANEOUS) ×3 IMPLANT
EXTRACTOR STONE NITINOL NGAGE (UROLOGICAL SUPPLIES) ×2 IMPLANT
GLOVE BIO SURGEON STRL SZ8 (GLOVE) ×3 IMPLANT
GLOVE BIOGEL PI IND STRL 6.5 (GLOVE) ×1 IMPLANT
GLOVE BIOGEL PI IND STRL 7.0 (GLOVE) ×4 IMPLANT
GLOVE BIOGEL PI INDICATOR 6.5 (GLOVE) ×1
GLOVE BIOGEL PI INDICATOR 7.0 (GLOVE) ×2
GLOVE SS BIOGEL STRL SZ 6.5 (GLOVE) ×1 IMPLANT
GLOVE SUPERSENSE BIOGEL SZ 6.5 (GLOVE) ×1
GOWN STRL REUS W/TWL LRG LVL3 (GOWN DISPOSABLE) ×3 IMPLANT
GOWN STRL REUS W/TWL XL LVL3 (GOWN DISPOSABLE) ×3 IMPLANT
GUIDEWIRE STR DUAL SENSOR (WIRE) ×3 IMPLANT
GUIDEWIRE STR ZIPWIRE 035X150 (MISCELLANEOUS) ×3 IMPLANT
IV NS IRRIG 3000ML ARTHROMATIC (IV SOLUTION) ×6 IMPLANT
KIT TURNOVER CYSTO (KITS) ×3 IMPLANT
MANIFOLD NEPTUNE II (INSTRUMENTS) ×3 IMPLANT
PACK CYSTO (CUSTOM PROCEDURE TRAY) ×3 IMPLANT
PAD ARMBOARD 7.5X6 YLW CONV (MISCELLANEOUS) ×3 IMPLANT
SYR 10ML LL (SYRINGE) ×3 IMPLANT
SYR CONTROL 10ML LL (SYRINGE) ×3 IMPLANT
TOWEL OR 17X26 4PK STRL BLUE (TOWEL DISPOSABLE) ×3 IMPLANT
WATER STERILE IRR 500ML POUR (IV SOLUTION) ×3 IMPLANT

## 2022-01-20 NOTE — Anesthesia Procedure Notes (Signed)
Procedure Name: LMA Insertion Date/Time: 01/20/2022 11:35 AM Performed by: Sharee Pimple, CRNA Pre-anesthesia Checklist: Patient identified, Emergency Drugs available, Suction available and Patient being monitored Patient Re-evaluated:Patient Re-evaluated prior to induction Oxygen Delivery Method: Circle system utilized Preoxygenation: Pre-oxygenation with 100% oxygen Induction Type: IV induction LMA: LMA inserted LMA Size: 4.0 Tube type: Oral Number of attempts: 1 Placement Confirmation: positive ETCO2 Tube secured with: Tape Dental Injury: Teeth and Oropharynx as per pre-operative assessment

## 2022-01-20 NOTE — Transfer of Care (Signed)
Immediate Anesthesia Transfer of Care Note  Patient: Michelle Holloway  Procedure(s) Performed: CYSTOSCOPY WITH RETROGRADE PYELOGRAM, URETEROSCOPY AND STENT REMOVAL (Right: Ureter) STONE EXTRACTION WITH BASKET (Right: Ureter)  Patient Location: PACU  Anesthesia Type:General  Level of Consciousness: awake, alert  and oriented  Airway & Oxygen Therapy: Patient Spontanous Breathing and Patient connected to face mask oxygen  Post-op Assessment: Report given to RN and Post -op Vital signs reviewed and stable  Post vital signs: Reviewed and stable  Last Vitals:  Vitals Value Taken Time  BP 130/84 01/20/22 1226  Temp 37.4 C 01/20/22 1226  Pulse 80 01/20/22 1226  Resp 16 01/20/22 1226  SpO2 100 % 01/20/22 1226  Vitals shown include unvalidated device data.  Last Pain:  Vitals:   01/20/22 1226  TempSrc: Tympanic  PainSc: 0-No pain         Complications: No notable events documented.

## 2022-01-20 NOTE — Anesthesia Preprocedure Evaluation (Signed)
Anesthesia Evaluation  Patient identified by MRN, date of birth, ID band Patient awake    Reviewed: Allergy & Precautions, H&P , NPO status , Patient's Chart, lab work & pertinent test results, reviewed documented beta blocker date and time   Airway Mallampati: II  TM Distance: >3 FB Neck ROM: full    Dental no notable dental hx.    Pulmonary neg pulmonary ROS,    Pulmonary exam normal breath sounds clear to auscultation       Cardiovascular Exercise Tolerance: Good negative cardio ROS   Rhythm:regular Rate:Normal     Neuro/Psych negative neurological ROS  negative psych ROS   GI/Hepatic negative GI ROS, Neg liver ROS,   Endo/Other  negative endocrine ROS  Renal/GU negative Renal ROS  negative genitourinary   Musculoskeletal   Abdominal   Peds  Hematology negative hematology ROS (+)   Anesthesia Other Findings   Reproductive/Obstetrics negative OB ROS                             Anesthesia Physical Anesthesia Plan  ASA: 2  Anesthesia Plan: General and General LMA   Post-op Pain Management:    Induction:   PONV Risk Score and Plan: Ondansetron  Airway Management Planned:   Additional Equipment:   Intra-op Plan:   Post-operative Plan:   Informed Consent: I have reviewed the patients History and Physical, chart, labs and discussed the procedure including the risks, benefits and alternatives for the proposed anesthesia with the patient or authorized representative who has indicated his/her understanding and acceptance.     Dental Advisory Given  Plan Discussed with: CRNA  Anesthesia Plan Comments:         Anesthesia Quick Evaluation  

## 2022-01-20 NOTE — Op Note (Signed)
Preoperative diagnosis: Right ureteral stone  Postoperative diagnosis: Same  Procedure: 1 cystoscopy 2 right retrograde pyelography 3.  Intraoperative fluoroscopy, under one hour, with interpretation 4.  Right ureteroscopic stone manipulation with basket extraction  Attending: Rosie Fate  Anesthesia: General  Estimated blood loss: None  Drains: none  Specimens: right ureteral calculus  Antibiotics: rocephin  Findings: right proximal ureteral calculus. No right hydronephrosis. No renal calculi.  Indications: Patient is a 41 year old female with a history of ureteral stone and who underwent right ureteral stent placement 3 weeks ago.  After discussing treatment options, she decided proceed with right ureteroscopic stone manipulation.  Procedure in detail: The patient was brought to the operating room and a brief timeout was done to ensure correct patient, correct procedure, correct site.  General anesthesia was administered patient was placed in dorsal lithotomy position.  Her genitalia was then prepped and draped in usual sterile fashion.  A rigid 106 French cystoscope was passed in the urethra and the bladder.  Bladder was inspected free masses or lesions.  the right ureteral orifices were in the normal orthotopic locations.  a 6 french ureteral catheter was then instilled into the right ureter orifice.  a gentle retrograde was obtained and findings noted above.  Using a grasper the right ureteral stent was brought to the urethral meatus. we then placed a zip wire through the ureteral stent and advanced up to the renal pelvis.  We then removed the stent. we then removed the cystoscope and cannulated the right ureteral orifice with a semirigid ureteroscope.  we then encountered the stone in the proximal ureter which was removed with a engage basket.  We then repeated ureteroscopy to the UPJ and encountered no additional calculus. We then advanced a sensor wire into the renal pelvis. We  removed the semirigid ureteroscope and then advanced a flexible ureteroscope over the sensor wire and up to the renal pelvis. We performed nephroscopy and noted no additional calculi. We then removed the scope and the wire. We elected no to leave a stent since this was an uncomplicated ureteroscopy.  the bladder was then drained and this concluded the procedure which was well tolerated by patient.  Complications: None  Condition: Stable, extubated, transferred to PACU  Plan: Patient is to be discharged home as to follow-up in one week f

## 2022-01-20 NOTE — Interval H&P Note (Signed)
History and Physical Interval Note:  01/20/2022 11:00 AM  Michelle Holloway  has presented today for surgery, with the diagnosis of right ureteral calculus.  The various methods of treatment have been discussed with the patient and family. After consideration of risks, benefits and other options for treatment, the patient has consented to  Procedure(s): CYSTOSCOPY WITH RETROGRADE PYELOGRAM, URETEROSCOPY AND STENT EXCHANGE (Right) HOLMIUM LASER APPLICATION (Right) as a surgical intervention.  The patient's history has been reviewed, patient examined, no change in status, stable for surgery.  I have reviewed the patient's chart and labs.  Questions were answered to the patient's satisfaction.     Nicolette Bang

## 2022-01-21 ENCOUNTER — Encounter (HOSPITAL_COMMUNITY): Payer: Self-pay | Admitting: Urology

## 2022-01-21 NOTE — Anesthesia Postprocedure Evaluation (Signed)
Anesthesia Post Note  Patient: Fabianna Keats  Procedure(s) Performed: CYSTOSCOPY WITH RETROGRADE PYELOGRAM, URETEROSCOPY AND STENT REMOVAL (Right: Ureter) STONE EXTRACTION WITH BASKET (Right: Ureter)  Patient location during evaluation: Phase II Anesthesia Type: General Level of consciousness: awake Pain management: pain level controlled Vital Signs Assessment: post-procedure vital signs reviewed and stable Respiratory status: spontaneous breathing and respiratory function stable Cardiovascular status: blood pressure returned to baseline and stable Postop Assessment: no headache and no apparent nausea or vomiting Anesthetic complications: no Comments: Late entry   No notable events documented.   Last Vitals:  Vitals:   01/20/22 1245 01/20/22 1306  BP: 112/78 114/80  Pulse: 75 92  Resp: 17   Temp:  (!) 36.4 C  SpO2: 98% 94%    Last Pain:  Vitals:   01/20/22 1311  TempSrc:   PainSc: 0-No pain                 Louann Sjogren

## 2022-01-25 LAB — CALCULI, WITH PHOTOGRAPH (CLINICAL LAB)
Calcium Oxalate Dihydrate: 10 %
Calcium Oxalate Monohydrate: 90 %
Weight Calculi: 45 mg

## 2022-01-28 ENCOUNTER — Ambulatory Visit: Payer: 59 | Admitting: Urology

## 2022-01-31 ENCOUNTER — Ambulatory Visit: Payer: 59 | Admitting: Skilled Nursing Facility1

## 2022-02-07 ENCOUNTER — Ambulatory Visit: Payer: 59 | Admitting: Urology

## 2022-02-07 ENCOUNTER — Encounter: Payer: Self-pay | Admitting: Urology

## 2022-02-07 VITALS — BP 108/69 | HR 87

## 2022-02-07 DIAGNOSIS — Z87442 Personal history of urinary calculi: Secondary | ICD-10-CM | POA: Diagnosis not present

## 2022-02-07 DIAGNOSIS — N200 Calculus of kidney: Secondary | ICD-10-CM

## 2022-02-07 MED ORDER — INDAPAMIDE 2.5 MG PO TABS: 2.5000 mg | ORAL_TABLET | Freq: Every day | ORAL | 3 refills | Status: DC

## 2022-02-07 NOTE — Patient Instructions (Signed)
Dietary Guidelines to Help Prevent Kidney Stones Kidney stones are deposits of minerals and salts that form inside your kidneys. Your risk of developing kidney stones may be greater depending on your diet, your lifestyle, the medicines you take, and whether you have certain medical conditions. Most people can lower their chances of developing kidney stones by following the instructions below. Your dietitian may give you more specific instructions depending on your overall health and the type of kidney stones you tend to develop. What are tips for following this plan? Reading food labels  Choose foods with "no salt added" or "low-salt" labels. Limit your salt (sodium) intake to less than 1,500 mg a day. Choose foods with calcium for each meal and snack. Try to eat about 300 mg of calcium at each meal. Foods that contain 200-500 mg of calcium a serving include: 8 oz (237 mL) of milk, calcium-fortifiednon-dairy milk, and calcium-fortifiedfruit juice. Calcium-fortified means that calcium has been added to these drinks. 8 oz (237 mL) of kefir, yogurt, and soy yogurt. 4 oz (114 g) of tofu. 1 oz (28 g) of cheese. 1 cup (150 g) of dried figs. 1 cup (91 g) of cooked broccoli. One 3 oz (85 g) can of sardines or mackerel. Most people need 1,000-1,500 mg of calcium a day. Talk to your dietitian about how much calcium is recommended for you. Shopping Buy plenty of fresh fruits and vegetables. Most people do not need to avoid fruits and vegetables, even if these foods contain nutrients that may contribute to kidney stones. When shopping for convenience foods, choose: Whole pieces of fruit. Pre-made salads with dressing on the side. Low-fat fruit and yogurt smoothies. Avoid buying frozen meals or prepared deli foods. These can be high in sodium. Look for foods with live cultures, such as yogurt and kefir. Choose high-fiber grains, such as whole-wheat breads, oat bran, and wheat cereals. Cooking Do not add  salt to food when cooking. Place a salt shaker on the table and allow each person to add his or her own salt to taste. Use vegetable protein, such as beans, textured vegetable protein (TVP), or tofu, instead of meat in pasta, casseroles, and soups. Meal planning Eat less salt, if told by your dietitian. To do this: Avoid eating processed or pre-made food. Avoid eating fast food. Eat less animal protein, including cheese, meat, poultry, or fish, if told by your dietitian. To do this: Limit the number of times you have meat, poultry, fish, or cheese each week. Eat a diet free of meat at least 2 days a week. Eat only one serving each day of meat, poultry, fish, or seafood. When you prepare animal protein, cut pieces into small portion sizes. For most meat and fish, one serving is about the size of the palm of your hand. Eat at least five servings of fresh fruits and vegetables each day. To do this: Keep fruits and vegetables on hand for snacks. Eat one piece of fruit or a handful of berries with breakfast. Have a salad and fruit at lunch. Have two kinds of vegetables at dinner. Limit foods that are high in a substance called oxalate. These include: Spinach (cooked), rhubarb, beets, sweet potatoes, and Swiss chard. Peanuts. Potato chips, french fries, and baked potatoes with skin on. Nuts and nut products. Chocolate. If you regularly take a diuretic medicine, make sure to eat at least 1 or 2 servings of fruits or vegetables that are high in potassium each day. These include: Avocado. Banana. Orange, prune,   carrot, or tomato juice. Baked potato. Cabbage. Beans and split peas. Lifestyle  Drink enough fluid to keep your urine pale yellow. This is the most important thing you can do. Spread your fluid intake throughout the day. If you drink alcohol: Limit how much you use to: 0-1 drink a day for women who are not pregnant. 0-2 drinks a day for men. Be aware of how much alcohol is in your  drink. In the [place] one drink equals one 12 oz bottle of beer (355 mL), one 5 oz glass of wine (148 mL), or one 1 oz glass of hard liquor (44 mL). Lose weight if told by your health care provider. Work with your dietitian to find an eating plan and weight loss strategies that work best for you. General information Talk to your health care provider and dietitian about taking daily supplements. You may be told the following depending on your health and the cause of your kidney stones: Not to take supplements with vitamin C. To take a calcium supplement. To take a daily probiotic supplement. To take other supplements such as magnesium, fish oil, or vitamin B6. Take over-the-counter and prescription medicines only as told by your health care provider. These include supplements. What foods should I limit? Limit your intake of the following foods, or eat them as told by your dietitian. Vegetables Spinach. Rhubarb. Beets. Canned vegetables. Pickles. Olives. Baked potatoes with skin. Grains Wheat bran. Baked goods. Salted crackers. Cereals high in sugar. Meats and other proteins Nuts. Nut butters. Large portions of meat, poultry, or fish. Salted, precooked, or cured meats, such as sausages, meat loaves, and hot dogs. Dairy Cheese. Beverages Regular soft drinks. Regular vegetable juice. Seasonings and condiments Seasoning blends with salt. Salad dressings. Soy sauce. Ketchup. Barbecue sauce. Other foods Canned soups. Canned pasta sauce. Casseroles. Pizza. Lasagna. Frozen meals. Potato chips. French fries. The items listed above may not be a complete list of foods and beverages you should limit. Contact a dietitian for more information. What foods should I avoid? Talk to your dietitian about specific foods you should avoid based on the type of kidney stones you have and your overall health. Fruits Grapefruit. The item listed above may not be a complete list of foods and beverages you should  avoid. Contact a dietitian for more information. Summary Kidney stones are deposits of minerals and salts that form inside your kidneys. You can lower your risk of kidney stones by making changes to your diet. The most important thing you can do is drink enough fluid. Drink enough fluid to keep your urine pale yellow. Talk to your dietitian about how much calcium you should have each day, and eat less salt and animal protein as told by your dietitian. This information is not intended to replace advice given to you by your health care provider. Make sure you discuss any questions you have with your health care provider. Document Revised: 04/19/2021 Document Reviewed: 04/19/2021 Elsevier Patient Education  2023 [employer]  

## 2022-02-07 NOTE — Addendum Note (Signed)
Addended byIris Pert on: 02/07/2022 01:08 PM   Modules accepted: Orders

## 2022-02-07 NOTE — Progress Notes (Signed)
02/07/2022 12:07 PM   Michelle Holloway 24-Jan-1981 [id number]  Referring provider: Eulogio Bear, NP No address on file  Followup nephrolithiasis   HPI: Michelle Holloway is a 41yo here for followup for nephrolithiasis. Stone composition was CaOx. She has a hx of gastric sleeve. She was taking vitamin C supplements. No flank pain. No other complaints today   PMH: Past Medical History:  Diagnosis Date   Adenomyosis    Blood transfusion without reported diagnosis    Endometriosis    Fibroid    H/O total hysterectomy    Hyperlipidemia 08/06/2021   Infertility, female    LLQ pain 08/31/2020   Ovarian cyst    Pain of toe of left foot 08/31/2020    Surgical History: Past Surgical History:  Procedure Laterality Date   ABDOMINAL HYSTERECTOMY N/A 05/2019   CYSTOSCOPY WITH RETROGRADE PYELOGRAM, URETEROSCOPY AND STENT PLACEMENT Right 01/20/2022   Procedure: CYSTOSCOPY WITH RETROGRADE PYELOGRAM, URETEROSCOPY AND STENT REMOVAL;  Surgeon: Cleon Gustin, MD;  Location: [facility];  Service: Urology;  Laterality: Right;   DILATION AND CURETTAGE OF UTERUS     STONE EXTRACTION WITH BASKET Right 01/20/2022   Procedure: STONE EXTRACTION WITH BASKET;  Surgeon: Cleon Gustin, MD;  Location: [facility];  Service: Urology;  Laterality: Right;    Home Medications:  Allergies as of 02/07/2022   No Known Allergies      Medication List        Accurate as of February 07, 2022 12:07 PM. If you have any questions, ask your nurse or doctor.          albuterol 108 (90 Base) MCG/ACT inhaler Commonly known as: VENTOLIN HFA Inhale 2 puffs into the lungs every 6 (six) hours as needed for wheezing or shortness of breath.   folic acid 1 MG tablet Commonly known as: FOLVITE Take 1 mg by mouth daily.   IRON-VITAMINS PO Take by mouth.   mirabegron ER 25 MG Tb24 tablet Commonly known as: MYRBETRIQ Take 1 tablet (25 mg total) by mouth daily.   MULTIVITAMIN ADULT PO Take by mouth.    oxyCODONE-acetaminophen 5-325 MG tablet Commonly known as: Percocet Take 1 tablet by mouth every 4 (four) hours as needed for severe pain.   vitamin B-12 1000 MCG tablet Commonly known as: CYANOCOBALAMIN Take 1,000 mcg by mouth daily.   vitamin C 500 MG tablet Commonly known as: ASCORBIC ACID Take 500 mg by mouth daily.   VITAMIN D PO Take by mouth.        Allergies: No Known Allergies  Family History: Family History  Problem Relation Age of Onset   Diabetes Mother    Hypertension Mother    Rheum arthritis Mother    Diabetes Father    Hypertension Father    Heart murmur Father    Diabetes Sister    Hypertension Sister    Rheum arthritis Sister    Diabetes Brother    Hypertension Brother    Diabetes Paternal Grandmother    Hypertension Paternal Grandmother    Brain cancer Maternal Uncle    Ovarian cancer Maternal Grandmother    Diabetes Maternal Grandmother    Hypertension Maternal Grandmother    Stroke Maternal Grandmother    Colon cancer Maternal Grandfather    Diabetes Maternal Grandfather    Hypertension Maternal Grandfather    Stroke Maternal Grandfather    Diabetes Paternal Grandfather    Hypertension Paternal Grandfather     Social History:  reports that she has never smoked.  She has never used smokeless tobacco. She reports that she does not currently use alcohol. She reports that she does not use drugs.  ROS: All other review of systems were reviewed and are negative except what is noted above in HPI  Physical Exam: BP 108/69   Pulse 87   LMP 08/30/2017 (Approximate)   Constitutional:  Alert and oriented, No acute distress. HEENT: [place] AT, moist mucus membranes.  Trachea midline, no masses. Cardiovascular: No clubbing, cyanosis, or edema. Respiratory: Normal respiratory effort, no increased work of breathing. GI: Abdomen is soft, nontender, nondistended, no abdominal masses GU: No CVA tenderness.  Lymph: No cervical or inguinal  lymphadenopathy. Skin: No rashes, bruises or suspicious lesions. Neurologic: Grossly intact, no focal deficits, moving all 4 extremities. Psychiatric: Normal mood and affect.  Laboratory Data: Lab Results  Component Value Date   WBC 7.5 08/05/2021   HGB 14.5 08/05/2021   HCT 44.1 08/05/2021   MCV 87.0 08/05/2021   PLT 279 08/05/2021    Lab Results  Component Value Date   CREATININE 0.86 08/05/2021    No results found for: "PSA"  No results found for: "TESTOSTERONE"  Lab Results  Component Value Date   HGBA1C 6.7 (H) 08/05/2021    Urinalysis    Component Value Date/Time   COLORURINE YELLOW 09/09/2020 0837   APPEARANCEUR Clear 01/10/2022 1145   LABSPEC 1.028 09/09/2020 0837   PHURINE 6.0 09/09/2020 0837   GLUCOSEU Negative 01/10/2022 [place] 09/09/2020 0837   BILIRUBINUR Negative 01/10/2022 1145   [place] 09/09/2020 0837   PROTEINUR 2+ (A) 01/10/2022 1145   PROTEINUR NEGATIVE 09/09/2020 0837   NITRITE Negative 01/10/2022 1145   NITRITE NEGATIVE 09/09/2020 0837   LEUKOCYTESUR 1+ (A) 01/10/2022 1145   LEUKOCYTESUR NEGATIVE 09/09/2020 0837    Lab Results  Component Value Date   LABMICR See below: 01/10/2022   WBCUA 11-30 (A) 01/10/2022   LABEPIT 0-10 01/10/2022   MUCUS Present 01/10/2022   BACTERIA Moderate (A) 01/10/2022    Pertinent Imaging:  No results found for this or any previous visit.  No results found for this or any previous visit.  No results found for this or any previous visit.  No results found for this or any previous visit.  No results found for this or any previous visit.  No results found for this or any previous visit.  No results found for this or any previous visit.  No results found for this or any previous visit.   Assessment & Plan:    1. Kidney stones -RTC 4-6 weeks with renal US -We will start indapamide 2.'5mg'$  daily - Urinalysis, Routine w reflex microscopic   No follow-ups on  file.  Nicolette Bang, MD  [facility] [place]

## 2022-03-09 ENCOUNTER — Ambulatory Visit: Payer: 59 | Admitting: Urology

## 2022-03-30 ENCOUNTER — Ambulatory Visit (HOSPITAL_COMMUNITY): Admission: RE | Admit: 2022-03-30 | Payer: 59 | Source: Ambulatory Visit

## 2022-03-30 ENCOUNTER — Encounter (INDEPENDENT_AMBULATORY_CARE_PROVIDER_SITE_OTHER): Payer: Self-pay

## 2022-04-06 ENCOUNTER — Ambulatory Visit: Payer: 59 | Admitting: Urology

## 2022-04-26 ENCOUNTER — Other Ambulatory Visit: Payer: Self-pay

## 2022-04-26 ENCOUNTER — Ambulatory Visit (INDEPENDENT_AMBULATORY_CARE_PROVIDER_SITE_OTHER): Payer: No Typology Code available for payment source | Admitting: Nurse Practitioner

## 2022-04-26 ENCOUNTER — Encounter (HOSPITAL_BASED_OUTPATIENT_CLINIC_OR_DEPARTMENT_OTHER): Payer: Self-pay | Admitting: Nurse Practitioner

## 2022-04-26 VITALS — BP 114/72 | HR 71 | Temp 97.6°F | Resp 18 | Ht 66.0 in | Wt 220.2 lb

## 2022-04-26 DIAGNOSIS — Z1321 Encounter for screening for nutritional disorder: Secondary | ICD-10-CM

## 2022-04-26 DIAGNOSIS — Z9884 Bariatric surgery status: Secondary | ICD-10-CM

## 2022-04-26 DIAGNOSIS — Z0000 Encounter for general adult medical examination without abnormal findings: Secondary | ICD-10-CM | POA: Diagnosis not present

## 2022-04-26 DIAGNOSIS — Z1329 Encounter for screening for other suspected endocrine disorder: Secondary | ICD-10-CM

## 2022-04-26 DIAGNOSIS — Z1231 Encounter for screening mammogram for malignant neoplasm of breast: Secondary | ICD-10-CM

## 2022-04-26 DIAGNOSIS — E1165 Type 2 diabetes mellitus with hyperglycemia: Secondary | ICD-10-CM

## 2022-04-26 DIAGNOSIS — E785 Hyperlipidemia, unspecified: Secondary | ICD-10-CM

## 2022-04-26 DIAGNOSIS — E559 Vitamin D deficiency, unspecified: Secondary | ICD-10-CM

## 2022-04-26 DIAGNOSIS — Z130 Encounter for screening for diseases of the blood and blood-forming organs and certain disorders involving the immune mechanism: Secondary | ICD-10-CM

## 2022-04-26 DIAGNOSIS — Z13228 Encounter for screening for other metabolic disorders: Secondary | ICD-10-CM

## 2022-04-26 HISTORY — DX: Bariatric surgery status: Z98.84

## 2022-04-26 LAB — HEMOGLOBIN A1C
Est. average glucose Bld gHb Est-mCnc: 117 mg/dL
Hgb A1c MFr Bld: 5.7 % — ABNORMAL HIGH (ref 4.8–5.6)

## 2022-04-26 NOTE — Assessment & Plan Note (Signed)
New patient to establish care with this office today. She is a former patient of Noemi Chapel with [facility]. Review of medical history, HM, and labs today. We will plan to CPE in December.

## 2022-04-26 NOTE — Assessment & Plan Note (Signed)
Significant improvement of BMI and overall feelings of health since gastric sleeve surgery in April. She is staying active and managing her diet very well. She is eager to make significant improvements to her health and this is evident by the progress she has made so far. Encouraged her to keep up her efforts and praised for her success so far.

## 2022-04-26 NOTE — Assessment & Plan Note (Signed)
Historical diagnosis of DM2 with recent gastric sleeve surgery and successful weight loss of >45lbs. She is not on any current medications for blood glucose control. She is monitoring her intake appropriately and keeping her portion sizes small. She is also working on exercise management. We will monitor her labs today for evaluation of the impact this has had on blood glucose control, among other things. I am very proud of the progress she has made thus far and am excited to join her on this journey.

## 2022-04-26 NOTE — Progress Notes (Signed)
Orma Render, DNP, AGNP-c [facility] [address]  [place], [place] [address] [phone]  New patient visit   Patient: Michelle Holloway   DOB: 11/20/1980   41 y.o. Female  MRN: [id number] Visit Date: 04/26/2022  Patient Care Team: Jadden Yim, Coralee Pesa, NP as PCP - General (Nurse Practitioner)  Today's Vitals   04/26/22 1340  BP: 114/72  Pulse: 71  Resp: 18  Temp: 97.6 F (36.4 C)  TempSrc: Temporal  SpO2: 100%  Weight: 220 lb 3.2 oz (99.9 kg)  Height: '5\' 6"'$  (1.676 m)   Body mass index is 35.54 kg/m.   Today's healthcare provider: Orma Render, NP   Chief Complaint  Patient presents with   New Patient (Initial Visit)    Patient states she is here to establish care and labs.   Subjective    Michelle Holloway is a 41 y.o. female who presents today as a new patient to establish care.    Patient endorses the following concerns presently: Labs for follow-up evaluation following bariatric surgery - Gastric sleeve performed on April 26th - doing well from surgery with no concerns or negative side effects - has currently lost greater than 45lbs - has not had follow-up labs  Last CPE was 08/06/2021 She needs a referral for mammogram  History reviewed and reveals the following: Past Medical History:  Diagnosis Date   Adenomyosis    Blood transfusion without reported diagnosis    Endometriosis    Fibroid    H/O total hysterectomy    Hyperlipidemia 08/06/2021   Infertility, female    LLQ pain 08/31/2020   Ovarian cyst    Pain of toe of left foot 08/31/2020   Prediabetes 01/28/2021   Swelling of lower extremity 01/28/2021   Past Surgical History:  Procedure Laterality Date   ABDOMINAL HYSTERECTOMY N/A 05/2019   CYSTOSCOPY WITH RETROGRADE PYELOGRAM, URETEROSCOPY AND STENT PLACEMENT Right 01/20/2022   Procedure: CYSTOSCOPY WITH RETROGRADE PYELOGRAM, URETEROSCOPY AND STENT REMOVAL;  Surgeon: Cleon Gustin, MD;   Location: [facility];  Service: Urology;  Laterality: Right;   DILATION AND CURETTAGE OF UTERUS     STONE EXTRACTION WITH BASKET Right 01/20/2022   Procedure: STONE EXTRACTION WITH BASKET;  Surgeon: Cleon Gustin, MD;  Location: [facility];  Service: Urology;  Laterality: Right;   Family Status  Relation Name Status   Mother  (Not Specified)   Father  (Not Specified)   Sister  (Not Specified)   Brother  (Not Specified)   PGM  (Not Specified)   Mat Uncle  (Not Specified)   MGM  (Not Specified)   MGF  (Not Specified)   PGF  (Not Specified)   Family History  Problem Relation Age of Onset   Diabetes Mother    Hypertension Mother    Rheum arthritis Mother    Diabetes Father    Hypertension Father    Heart murmur Father    Diabetes Sister    Hypertension Sister    Rheum arthritis Sister    Diabetes Brother    Hypertension Brother    Diabetes Paternal Grandmother    Hypertension Paternal Grandmother    Brain cancer Maternal Uncle    Ovarian cancer Maternal Grandmother    Diabetes Maternal Grandmother    Hypertension Maternal Grandmother    Stroke Maternal Grandmother    Colon cancer Maternal Grandfather    Diabetes Maternal Grandfather    Hypertension Maternal Grandfather    Stroke Maternal  Grandfather    Diabetes Paternal Grandfather    Hypertension Paternal Grandfather    Social History   Socioeconomic History   Marital status: Married    Spouse name: Not on file   Number of children: Not on file   Years of education: Not on file   Highest education level: Not on file  Occupational History   Not on file  Tobacco Use   Smoking status: Never   Smokeless tobacco: Never  Vaping Use   Vaping Use: Never used  Substance and Sexual Activity   Alcohol use: Not Currently   Drug use: Never   Sexual activity: Yes    Partners: Male    Birth control/protection: Surgical    Comment: hysterectomy  Other Topics Concern   Not on file  Social History Narrative   Not on file    Social Determinants of Health   Financial Resource Strain: Not on file  Food Insecurity: Not on file  Transportation Needs: Not on file  Physical Activity: Not on file  Stress: Not on file  Social Connections: Not on file   Outpatient Medications Prior to Visit  Medication Sig   Multiple Vitamin (MULTIVITAMIN ADULT PO) Take by mouth.   vitamin B-12 (CYANOCOBALAMIN) 1000 MCG tablet Take 1,000 mcg by mouth daily.   VITAMIN D PO Take by mouth.   [DISCONTINUED] indapamide (LOZOL) 2.5 MG tablet Take 1 tablet (2.5 mg total) by mouth daily.   [DISCONTINUED] vitamin C (ASCORBIC ACID) 500 MG tablet Take 500 mg by mouth daily.   [DISCONTINUED] albuterol (VENTOLIN HFA) 108 (90 Base) MCG/ACT inhaler Inhale 2 puffs into the lungs every 6 (six) hours as needed for wheezing or shortness of breath. (Patient not taking: Reported on 01/10/2022)   [DISCONTINUED] folic acid (FOLVITE) 1 MG tablet Take 1 mg by mouth daily. (Patient not taking: Reported on 04/26/2022)   [DISCONTINUED] IRON-VITAMINS PO Take by mouth. (Patient not taking: Reported on 04/26/2022)   [DISCONTINUED] mirabegron ER (MYRBETRIQ) 25 MG TB24 tablet Take 1 tablet (25 mg total) by mouth daily. (Patient not taking: Reported on 02/07/2022)   [DISCONTINUED] oxyCODONE-acetaminophen (PERCOCET) 5-325 MG tablet Take 1 tablet by mouth every 4 (four) hours as needed for severe pain. (Patient not taking: Reported on 02/07/2022)   No facility-administered medications prior to visit.   No Known Allergies Immunization History  Administered Date(s) Administered   PFIZER(Purple Top)SARS-COV-2 Vaccination 11/11/2019, 12/09/2019    Review of Systems All review of systems negative except what is listed in the HPI   Objective    BP 114/72   Pulse 71   Temp 97.6 F (36.4 C) (Temporal)   Resp 18   Ht '5\' 6"'$  (1.676 m)   Wt 220 lb 3.2 oz (99.9 kg)   LMP 08/30/2017 (Approximate)   SpO2 100%   BMI 35.54 kg/m  Physical Exam Vitals and nursing note  reviewed.  Constitutional:      General: She is not in acute distress.    Appearance: Normal appearance.  Eyes:     Extraocular Movements: Extraocular movements intact.     Conjunctiva/sclera: Conjunctivae normal.     Pupils: Pupils are equal, round, and reactive to light.  Neck:     Vascular: No carotid bruit.  Cardiovascular:     Rate and Rhythm: Normal rate and regular rhythm.     Pulses: Normal pulses.     Heart sounds: Normal heart sounds. No murmur heard. Pulmonary:     Effort: Pulmonary effort is normal.  Breath sounds: Normal breath sounds. No wheezing.  Abdominal:     General: Bowel sounds are normal.     Palpations: Abdomen is soft.  Musculoskeletal:        General: Normal range of motion.     Cervical back: Normal range of motion.     Right lower leg: No edema.     Left lower leg: No edema.  Skin:    General: Skin is warm and dry.     Capillary Refill: Capillary refill takes less than 2 seconds.  Neurological:     General: No focal deficit present.     Mental Status: She is alert and oriented to person, place, and time.  Psychiatric:        Mood and Affect: Mood normal.        Behavior: Behavior normal.        Thought Content: Thought content normal.        Judgment: Judgment normal.     No results found for any visits on 04/26/22.  Assessment & Plan      Problem List Items Addressed This Visit     Severe obesity (BMI 35.0-39.9) with comorbidity ([place])    Significant improvement of BMI and overall feelings of health since gastric sleeve surgery in April. She is staying active and managing her diet very well. She is eager to make significant improvements to her health and this is evident by the progress she has made so far. Encouraged her to keep up her efforts and praised for her success so far.       Hyperlipidemia    Chronic. We will monitor labs today for evaluation. 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   Recommend monitoring diet and keeping saturated fats to a minimum with no more than 13g divided throughout the day. Foods such as whole grains, beans, nuts, seeds, and berries are recommended for their high healthy fat content. Recommend exercise of at least 150 minutes per week.  Will make changes to plan of care as appropriate based on her labs.       Vitamin D deficiency    Historically chronic deficiency. Since gastric sleeve surgery no repeat evaluation performed. Will obtain labs today for evaluation and monitoring. Will make changes to the plan of care as appropriate based on results.       History of gastric bypass    Labs today to monitor for vitamin and mineral deficiencies as well as management of chronic medical conditions. She has not had repeat labs since the surgery. No alarm symptoms are present at this time. We will plan for routine labs and specific monitoring as recommended for patients with gastric sleeve surgery to ensure that there are no deficiencies present.       Relevant Orders   CBC With Diff/Platelet   Comprehensive metabolic panel   Lipid panel   Iron, TIBC and Ferritin Panel   B12 and Folate Panel   VITAMIN D 25 Hydroxy (Vit-D Deficiency, Fractures)   Thyroid Panel With TSH   Vitamin A   Vitamin B1   Copper, serum   Zinc   Encounter for medical examination to establish care - Primary    New patient to establish care with this office today. She is a former patient of Noemi Chapel with [facility]. Review of medical history, HM, and labs today. We will plan to CPE in December.       Relevant Orders  CBC With Diff/Platelet   Comprehensive metabolic panel   Lipid panel   Iron, TIBC and Ferritin Panel   B12 and Folate Panel   VITAMIN D 25 Hydroxy (Vit-D Deficiency, Fractures)   Thyroid Panel With TSH   Vitamin A   Vitamin B1   Copper, serum   Zinc   Type 2 diabetes mellitus with hyperglycemia, without long-term current use of insulin (HCC)     Historical diagnosis of DM2 with recent gastric sleeve surgery and successful weight loss of >45lbs. She is not on any current medications for blood glucose control. She is monitoring her intake appropriately and keeping her portion sizes small. She is also working on exercise management. We will monitor her labs today for evaluation of the impact this has had on blood glucose control, among other things. I am very proud of the progress she has made thus far and am excited to join her on this journey.       Relevant Orders   Hemoglobin A1c   Other Visit Diagnoses     Screening for endocrine, nutritional, metabolic and immunity disorder       Relevant Orders   CBC With Diff/Platelet   Comprehensive metabolic panel   Lipid panel   Iron, TIBC and Ferritin Panel   B12 and Folate Panel   VITAMIN D 25 Hydroxy (Vit-D Deficiency, Fractures)   Thyroid Panel With TSH   Vitamin A   Vitamin B1   Copper, serum   Zinc   Encounter for screening mammogram for malignant neoplasm of breast       Relevant Orders   MM DIGITAL SCREENING BILATERAL        Return for TBD based on labs.      Bradyn Soward, Coralee Pesa, NP, DNP, AGNP-C [facility] [place] Maintenance Due Health Maintenance Topics with due status: Overdue     Topic Date Due   FOOT EXAM Never done   OPHTHALMOLOGY EXAM Never done   URINE MICROALBUMIN Never done   COVID-19 Vaccine 02/03/2020   HEMOGLOBIN A1C 02/03/2022    CPE Due: after 08/06/2022 Labs Due: today

## 2022-04-26 NOTE — Assessment & Plan Note (Signed)
Labs today to monitor for vitamin and mineral deficiencies as well as management of chronic medical conditions. She has not had repeat labs since the surgery. No alarm symptoms are present at this time. We will plan for routine labs and specific monitoring as recommended for patients with gastric sleeve surgery to ensure that there are no deficiencies present.

## 2022-04-26 NOTE — Patient Instructions (Addendum)
Thank you for choosing [place] at [facility] for your Primary Care needs. I am excited for the opportunity to partner with you to meet your health care goals. It was a pleasure meeting you today!  Recommendations from today's visit: We will get your labs today and make sure everything looks good.  I am so proud of you and all you have accomplished on your weight loss journey! Keep up the great work!! I have sent the referral for a mammogram- they will call you to schedule this.   Information on diet, exercise, and health maintenance recommendations are listed below. This is information to help you be sure you are on track for optimal health and monitoring.   Please look over this and let us know if you have any questions or if you have completed any of the health maintenance outside of [facility] so that we can be sure your records are up to date.  ___________________________________________________________ About Me: I am an Adult-Geriatric Nurse Practitioner with a background in caring for patients for more than 20 years with a strong intensive care background. I provide primary care and sports medicine services to patients age 69 and older within this office. My education had a strong focus on caring for the older adult population, which I am passionate about. I am also the director of the APP Fellowship with [facility].   My desire is to provide you with the best service through preventive medicine and supportive care. I consider you a part of the medical team and value your input. I work diligently to ensure that you are heard and your needs are met in a safe and effective manner. I want you to feel comfortable with me as your provider and want you to know that your health concerns are important to me.  For your information, our office hours are: Monday, Tuesday, and Thursday 8:00 AM - 5:00 PM Wednesday and Friday 8:00 AM - 12:00 PM.   In my time away from the  office I am teaching new APP's within the system and am unavailable, but my partner, Dr. Burnard Bunting is in the office for emergent needs.   If you have questions or concerns, please call our office at [phone] or send [place] a MyChart message and we will respond as quickly as possible.  ____________________________________________________________ MyChart:  For all urgent or time sensitive needs we ask that you please call the office to avoid delays. Our number is [phone]. MyChart is not constantly monitored and due to the large volume of messages a day, replies may take up to 72 business hours.  MyChart Policy: MyChart allows for you to see your visit notes, after visit summary, provider recommendations, lab and tests results, make an appointment, request refills, and contact your provider or the office for non-urgent questions or concerns. Providers are seeing patients during normal business hours and do not have built in time to review MyChart messages.  We ask that you allow a minimum of 3 business days for responses to [employer]. For this reason, please do not send urgent requests through [place]. Please call the office at [phone]. New and ongoing conditions may require a visit. We have virtual and in person visit available for your convenience.  Complex MyChart concerns may require a visit. Your provider may request you schedule a virtual or in person visit to ensure we are providing the best care possible. MyChart messages sent after 11:00 AM on Friday will not be received  by the provider until Monday morning.    Lab and Test Results: You will receive your lab and test results on MyChart as soon as they are completed and results have been sent by the lab or testing facility. Due to this service, you will receive your results BEFORE your provider.  I review lab and tests results each morning prior to seeing patients. Some results require collaboration with other providers to  ensure you are receiving the most appropriate care. For this reason, we ask that you please allow a minimum of 3-5 business days from the time the ALL results have been received for your provider to receive and review lab and test results and contact you about these.  Most lab and test result comments from the provider will be sent through [place]. Your provider may recommend changes to the plan of care, follow-up visits, repeat testing, ask questions, or request an office visit to discuss these results. You may reply directly to this message or call the office at [phone] to provide information for the provider or set up an appointment. In some instances, you will be called with test results and recommendations. Please let us know if this is preferred and we will make note of this in your chart to provide this for you.    If you have not heard a response to your lab or test results in 5 business days from all results returning to [place], please call the office to let us know. We ask that you please avoid calling prior to this time unless there is an emergent concern. Due to high call volumes, this can delay the resulting process.  After Hours: For all non-emergency after hours needs, please call the office at [phone] and select the option to reach the on-call provider service. On-call services are shared between multiple [place] offices and therefore it will not be possible to speak directly with your provider. On-call providers may provide medical advice and recommendations, but are unable to provide refills for maintenance medications.  For all emergency or urgent medical needs after normal business hours, we recommend that you seek care at the closest Urgent Care or Emergency Department to ensure appropriate treatment in a timely manner.  [facility] [place] has a 24 hour emergency room located on the ground floor for your convenience.   Urgent Concerns During the Business  Day Providers are seeing patients from 8AM to [place] with a busy schedule and are most often not able to respond to non-urgent calls until the end of the day or the next business day. If you should have URGENT concerns during the day, please call and speak to the nurse or schedule a same day appointment so that we can address your concern without delay.   Thank you, again, for choosing me as your health care partner. I appreciate your trust and look forward to learning more about you.   Worthy Keeler, DNP, AGNP-c ___________________________________________________________  Health Maintenance Recommendations Screening Testing Mammogram Every 1 -2 years based on history and risk factors Starting at age 42 Pap Smear Ages 21-39 every 3 years Ages 69-65 every 5 years with HPV testing More frequent testing may be required based on results and history Colon Cancer Screening Every 1-10 years based on test performed, risk factors, and history Starting at age 21 Bone Density Screening Every 2-10 years based on history Starting at age 22 for women Recommendations for men differ based on medication usage, history, and risk factors AAA Screening  One time ultrasound Men 71-47 years old who have every smoked Lung Cancer Screening Low Dose Lung CT every 12 months Age 54-80 years with a 30 pack-year smoking history who still smoke or who have quit within the last 15 years  Screening Labs Routine  Labs: Complete Blood Count (CBC), Complete Metabolic Panel (CMP), Cholesterol (Lipid Panel) Every 6-12 months based on history and medications May be recommended more frequently based on current conditions or previous results Hemoglobin A1c Lab Every 3-12 months based on history and previous results Starting at age 68 or earlier with diagnosis of diabetes, high cholesterol, BMI >26, and/or risk factors Frequent monitoring for patients with diabetes to ensure blood sugar control Thyroid Panel (TSH w/ T3  & T4) Every 6 months based on history, symptoms, and risk factors May be repeated more often if on medication HIV One time testing for all patients 18 and older May be repeated more frequently for patients with increased risk factors or exposure Hepatitis C One time testing for all patients 82 and older May be repeated more frequently for patients with increased risk factors or exposure Gonorrhea, Chlamydia Every 12 months for all sexually active persons 13-24 years Additional monitoring may be recommended for those who are considered high risk or who have symptoms PSA Men 15-98 years old with risk factors Additional screening may be recommended from age 16-69 based on risk factors, symptoms, and history  Vaccine Recommendations Tetanus Booster All adults every 10 years Flu Vaccine All patients 6 months and older every year COVID Vaccine All patients 12 years and older Initial dosing with booster May recommend additional booster based on age and health history HPV Vaccine 2 doses all patients age 62-26 Dosing may be considered for patients over 26 Shingles Vaccine (Shingrix) 2 doses all adults 71 years and older Pneumonia (Pneumovax 23) All adults 3 years and older May recommend earlier dosing based on health history Pneumonia (Prevnar 52) All adults 49 years and older Dosed 1 year after Pneumovax 23  Additional Screening, Testing, and Vaccinations may be recommended on an individualized basis based on family history, health history, risk factors, and/or exposure.  __________________________________________________________  Diet Recommendations for All Patients  I recommend that all patients maintain a diet low in saturated fats, carbohydrates, and cholesterol. While this can be challenging at first, it is not impossible and small changes can make big differences.  Things to try: Decreasing the amount of soda, sweet tea, and/or juice to one or less per day and replace with  water While water is always the first choice, if you do not like water you may consider adding a water additive without sugar to improve the taste other sugar free drinks Replace potatoes with a brightly colored vegetable at dinner Use healthy oils, such as canola oil or olive oil, instead of butter or hard margarine Limit your bread intake to two pieces or less a day Replace regular pasta with low carb pasta options Bake, broil, or grill foods instead of frying Monitor portion sizes  Eat smaller, more frequent meals throughout the day instead of large meals  An important thing to remember is, if you love foods that are not great for your health, you don't have to give them up completely. Instead, allow these foods to be a reward when you have done well. Allowing yourself to still have special treats every once in a while is a nice way to tell yourself thank you for working hard to keep yourself healthy.  Also remember that every day is a new day. If you have a bad day and "fall off the wagon", you can still climb right back up and keep moving along on your journey!  We have resources available to help you!  Some websites that may be helpful include: www.http://carter.biz/  Www.VeryWellFit.com _____________________________________________________________  Activity Recommendations for All Patients  I recommend that all adults get at least 20 minutes of moderate physical activity that elevates your heart rate at least 5 days out of the week.  Some examples include: Walking or jogging at a pace that allows you to carry on a conversation Cycling (stationary bike or outdoors) Water aerobics Yoga Weight lifting Dancing If physical limitations prevent you from putting stress on your joints, exercise in a pool or seated in a chair are excellent options.  Do determine your MAXIMUM heart rate for activity: YOUR AGE - 220 = MAX HeartRate   Remember! Do not push yourself too hard.  Start slowly  and build up your pace, speed, weight, time in exercise, etc.  Allow your body to rest between exercise and get good sleep. You will need more water than normal when you are exerting yourself. Do not wait until you are thirsty to drink. Drink with a purpose of getting in at least 8, 8 ounce glasses of water a day plus more depending on how much you exercise and sweat.    If you begin to develop dizziness, chest pain, abdominal pain, jaw pain, shortness of breath, headache, vision changes, lightheadedness, or other concerning symptoms, stop the activity and allow your body to rest. If your symptoms are severe, seek emergency evaluation immediately. If your symptoms are concerning, but not severe, please let us know so that we can recommend further evaluation.

## 2022-04-26 NOTE — Assessment & Plan Note (Signed)
Chronic. We will monitor labs today for evaluation. 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  Recommend monitoring diet and keeping saturated fats to a minimum with no more than 13g divided throughout the day. Foods such as whole grains, beans, nuts, seeds, and berries are recommended for their high healthy fat content. Recommend exercise of at least 150 minutes per week.  Will make changes to plan of care as appropriate based on her labs.

## 2022-04-26 NOTE — Assessment & Plan Note (Signed)
Historically chronic deficiency. Since gastric sleeve surgery no repeat evaluation performed. Will obtain labs today for evaluation and monitoring. Will make changes to the plan of care as appropriate based on results.

## 2022-05-01 LAB — CBC WITH DIFF/PLATELET
Basophils Absolute: 0 10*3/uL (ref 0.0–0.2)
Basos: 1 %
EOS (ABSOLUTE): 0.4 10*3/uL (ref 0.0–0.4)
Eos: 8 %
Hematocrit: 41.4 % (ref 34.0–46.6)
Hemoglobin: 13.3 g/dL (ref 11.1–15.9)
Immature Grans (Abs): 0 10*3/uL (ref 0.0–0.1)
Immature Granulocytes: 0 %
Lymphocytes Absolute: 2.9 10*3/uL (ref 0.7–3.1)
Lymphs: 51 %
MCH: 28.5 pg (ref 26.6–33.0)
MCHC: 32.1 g/dL (ref 31.5–35.7)
MCV: 89 fL (ref 79–97)
Monocytes Absolute: 0.5 10*3/uL (ref 0.1–0.9)
Monocytes: 10 %
Neutrophils Absolute: 1.6 10*3/uL (ref 1.4–7.0)
Neutrophils: 30 %
Platelets: 239 10*3/uL (ref 150–450)
RBC: 4.66 x10E6/uL (ref 3.77–5.28)
RDW: 13.1 % (ref 11.7–15.4)
WBC: 5.5 10*3/uL (ref 3.4–10.8)

## 2022-05-01 LAB — COMPREHENSIVE METABOLIC PANEL
ALT: 12 IU/L (ref 0–32)
AST: 17 IU/L (ref 0–40)
Albumin/Globulin Ratio: 1.6 (ref 1.2–2.2)
Albumin: 4.1 g/dL (ref 3.9–4.9)
Alkaline Phosphatase: 90 IU/L (ref 44–121)
BUN/Creatinine Ratio: 16 (ref 9–23)
BUN: 14 mg/dL (ref 6–24)
Bilirubin Total: 0.3 mg/dL (ref 0.0–1.2)
CO2: 22 mmol/L (ref 20–29)
Calcium: 9.3 mg/dL (ref 8.7–10.2)
Chloride: 104 mmol/L (ref 96–106)
Creatinine, Ser: 0.87 mg/dL (ref 0.57–1.00)
Globulin, Total: 2.6 g/dL (ref 1.5–4.5)
Glucose: 102 mg/dL — ABNORMAL HIGH (ref 70–99)
Potassium: 4.5 mmol/L (ref 3.5–5.2)
Sodium: 139 mmol/L (ref 134–144)
Total Protein: 6.7 g/dL (ref 6.0–8.5)
eGFR: 85 mL/min/{1.73_m2} (ref >59)

## 2022-05-01 LAB — LIPID PANEL
Chol/HDL Ratio: 3 ratio (ref 0.0–4.4)
Cholesterol, Total: 200 mg/dL — ABNORMAL HIGH (ref 100–199)
HDL: 66 mg/dL (ref >39)
LDL Chol Calc (NIH): 119 mg/dL — ABNORMAL HIGH (ref 0–99)
Triglycerides: 86 mg/dL (ref 0–149)
VLDL Cholesterol Cal: 15 mg/dL (ref 5–40)

## 2022-05-01 LAB — IRON,TIBC AND FERRITIN PANEL
Ferritin: 184 ng/mL — ABNORMAL HIGH (ref 15–150)
Iron Saturation: 30 % (ref 15–55)
Iron: 75 ug/dL (ref 27–159)
Total Iron Binding Capacity: 249 ug/dL — ABNORMAL LOW (ref 250–450)
UIBC: 174 ug/dL (ref 131–425)

## 2022-05-01 LAB — VITAMIN A: Vitamin A: 31.9 ug/dL (ref 20.1–62.0)

## 2022-05-01 LAB — B12 AND FOLATE PANEL
Folate: 5.2 ng/mL (ref >3.0)
Vitamin B-12: 551 pg/mL (ref 232–1245)

## 2022-05-01 LAB — THYROID PANEL WITH TSH
Free Thyroxine Index: 2.1 (ref 1.2–4.9)
T3 Uptake Ratio: 28 % (ref 24–39)
T4, Total: 7.4 ug/dL (ref 4.5–12.0)
TSH: 2.59 u[IU]/mL (ref 0.450–4.500)

## 2022-05-01 LAB — VITAMIN B1: Thiamine: 71 nmol/L (ref 66.5–200.0)

## 2022-05-01 LAB — VITAMIN D 25 HYDROXY (VIT D DEFICIENCY, FRACTURES): Vit D, 25-Hydroxy: 23.5 ng/mL — ABNORMAL LOW (ref 30.0–100.0)

## 2022-05-01 LAB — COPPER, SERUM: Copper: 131 ug/dL (ref 80–158)

## 2022-05-01 LAB — ZINC: Zinc: 59 ug/dL (ref 44–115)

## 2022-05-02 ENCOUNTER — Encounter (HOSPITAL_BASED_OUTPATIENT_CLINIC_OR_DEPARTMENT_OTHER): Payer: Self-pay | Admitting: Radiology

## 2022-05-02 ENCOUNTER — Ambulatory Visit (HOSPITAL_BASED_OUTPATIENT_CLINIC_OR_DEPARTMENT_OTHER)
Admission: RE | Admit: 2022-05-02 | Discharge: 2022-05-02 | Disposition: A | Discharge status: Home / Self Care | Payer: No Typology Code available for payment source | Source: Ambulatory Visit | Attending: Nurse Practitioner | Admitting: Nurse Practitioner

## 2022-05-02 DIAGNOSIS — Z1231 Encounter for screening mammogram for malignant neoplasm of breast: Secondary | ICD-10-CM | POA: Insufficient documentation

## 2022-05-02 MED ORDER — VITAMIN D (ERGOCALCIFEROL) 1.25 MG (50000 UNIT) PO CAPS: 50000.0000 [IU] | ORAL_CAPSULE | ORAL | 3 refills | Status: DC

## 2022-05-02 NOTE — Addendum Note (Signed)
Addended by: Terrianne Cavness, Clarise Cruz E on: 05/02/2022 07:45 AM   Modules accepted: Orders

## 2022-05-03 ENCOUNTER — Encounter (HOSPITAL_BASED_OUTPATIENT_CLINIC_OR_DEPARTMENT_OTHER): Payer: Self-pay | Admitting: Nurse Practitioner

## 2022-05-05 ENCOUNTER — Other Ambulatory Visit: Payer: Self-pay | Admitting: Nurse Practitioner

## 2022-05-05 DIAGNOSIS — R928 Other abnormal and inconclusive findings on diagnostic imaging of breast: Secondary | ICD-10-CM

## 2022-06-02 ENCOUNTER — Ambulatory Visit
Admission: RE | Admit: 2022-06-02 | Discharge: 2022-06-02 | Disposition: A | Discharge status: Home / Self Care | Payer: 59 | Source: Ambulatory Visit | Attending: Nurse Practitioner | Admitting: Nurse Practitioner

## 2022-06-02 ENCOUNTER — Other Ambulatory Visit: Payer: Self-pay | Admitting: Nurse Practitioner

## 2022-06-02 ENCOUNTER — Ambulatory Visit
Admission: RE | Admit: 2022-06-02 | Discharge: 2022-06-02 | Disposition: A | Discharge status: Home / Self Care | Payer: No Typology Code available for payment source | Source: Ambulatory Visit | Attending: Nurse Practitioner | Admitting: Nurse Practitioner

## 2022-06-02 DIAGNOSIS — N6310 Unspecified lump in the right breast, unspecified quadrant: Secondary | ICD-10-CM

## 2022-06-02 DIAGNOSIS — R928 Other abnormal and inconclusive findings on diagnostic imaging of breast: Secondary | ICD-10-CM

## 2022-06-09 ENCOUNTER — Ambulatory Visit
Admission: RE | Admit: 2022-06-09 | Discharge: 2022-06-09 | Disposition: A | Discharge status: Home / Self Care | Payer: No Typology Code available for payment source | Source: Ambulatory Visit | Attending: Nurse Practitioner | Admitting: Nurse Practitioner

## 2022-06-09 DIAGNOSIS — N6310 Unspecified lump in the right breast, unspecified quadrant: Secondary | ICD-10-CM

## 2022-07-04 ENCOUNTER — Ambulatory Visit (INDEPENDENT_AMBULATORY_CARE_PROVIDER_SITE_OTHER): Payer: No Typology Code available for payment source | Admitting: Nurse Practitioner

## 2022-07-04 VITALS — BP 117/81 | HR 76 | Temp 98.4°F | Ht 66.0 in | Wt 210.0 lb

## 2022-07-04 DIAGNOSIS — B9689 Other specified bacterial agents as the cause of diseases classified elsewhere: Secondary | ICD-10-CM

## 2022-07-04 DIAGNOSIS — J208 Acute bronchitis due to other specified organisms: Secondary | ICD-10-CM

## 2022-07-04 MED ORDER — PREDNISONE 20 MG PO TABS: 40.0000 mg | ORAL_TABLET | Freq: Every day | ORAL | 0 refills | Status: DC

## 2022-07-04 MED ORDER — HYDROCODONE BIT-HOMATROP MBR 5-1.5 MG/5ML PO SOLN: 5.0000 mL | Freq: Every evening | ORAL | 0 refills | Status: DC | PRN

## 2022-07-04 MED ORDER — AMOXICILLIN-POT CLAVULANATE 875-125 MG PO TABS: 1.0000 | ORAL_TABLET | Freq: Two times a day (BID) | ORAL | 0 refills | Status: DC

## 2022-07-04 NOTE — Patient Instructions (Signed)
If you are not feeling better towards the end of the week, please let me know. If you have any needs, I will be in my new office starting Monday and you can reach me there. :-)

## 2022-07-04 NOTE — Progress Notes (Signed)
Orma Render, DNP, AGNP-c Primary Care & Sports Medicine [address]  [place], [place] [address] [phone]  Subjective:   Michelle Holloway is a 41 y.o. female presents to day for evaluation of: Cough (Has been going on for two weeks.)  Cough 2 weeks ago started- it has not gotten any better or any worse. She has tried dayquil, nyquil, and OTC cough medications She tells me the cough wakes her up every night  She tells me she is bringing up phlegm, but it is not dark in color  She does not think she has had fevers, but she has had some chills.  She has had some sinus pain and pressure, but that has lightened up some.  She has coughed so much that her back   PMH, Medications, and Allergies reviewed and updated in chart as appropriate.   ROS negative except for what is listed in HPI. Objective:  BP 117/81 (Patient Position: Sitting)   Pulse 76   Temp 98.4 F (36.9 C)   Ht '5\' 6"'$  (1.676 m)   Wt 210 lb (95.3 kg)   LMP 08/30/2017 (Approximate)   SpO2 98%   BMI 33.89 kg/m  Physical Exam Vitals and nursing note reviewed.  Constitutional:      Appearance: Normal appearance. She is not ill-appearing.  HENT:     Head: Normocephalic.     Mouth/Throat:     Mouth: Mucous membranes are moist.     Pharynx: No oropharyngeal exudate or posterior oropharyngeal erythema.  Eyes:     Extraocular Movements: Extraocular movements intact.     Pupils: Pupils are equal, round, and reactive to light.  Neck:     Vascular: No carotid bruit.  Cardiovascular:     Rate and Rhythm: Normal rate and regular rhythm.     Pulses: Normal pulses.     Heart sounds: Normal heart sounds.  Pulmonary:     Effort: Pulmonary effort is normal.     Breath sounds: Wheezing and rhonchi present.  Abdominal:     Palpations: Abdomen is soft.  Lymphadenopathy:     Cervical: Cervical adenopathy present.  Skin:    General: Skin is warm and dry.     Capillary Refill: Capillary  refill takes less than 2 seconds.  Neurological:     General: No focal deficit present.     Mental Status: She is alert and oriented to person, place, and time.  Psychiatric:        Mood and Affect: Mood normal.           Assessment & Plan:   Problem List Items Addressed This Visit     Acute bacterial bronchitis - Primary    Bilateral wheezing and rhonchus present with cough during examination. She does have lymphadenopathy in the cervical region. Given the length of time symptoms have been present, I suspect that the etiology of the symptoms are bacterial at this time. Will send antibiotics for treatment and hycodan cough syrup for use at night to facilitate decreased cough and rest. Will also send treatment with prednisone burst for reduction of inflammation. She will follow up if her symptoms are not resolved in the next 10 -14 days.       Relevant Medications   amoxicillin-clavulanate (AUGMENTIN) 875-125 MG tablet   predniSONE (DELTASONE) 20 MG tablet   HYDROcodone bit-homatropine (HYCODAN) 5-1.5 MG/5ML syrup      Orma Render, DNP, AGNP-c 08/19/2022  8:17 PM    History,  Medications, Surgery, SDOH, and Family History reviewed and updated as appropriate.

## 2022-07-06 ENCOUNTER — Encounter (HOSPITAL_BASED_OUTPATIENT_CLINIC_OR_DEPARTMENT_OTHER): Payer: Self-pay | Admitting: Nurse Practitioner

## 2022-08-19 DIAGNOSIS — B9689 Other specified bacterial agents as the cause of diseases classified elsewhere: Secondary | ICD-10-CM | POA: Insufficient documentation

## 2022-08-19 NOTE — Assessment & Plan Note (Signed)
Bilateral wheezing and rhonchus present with cough during examination. She does have lymphadenopathy in the cervical region. Given the length of time symptoms have been present, I suspect that the etiology of the symptoms are bacterial at this time. Will send antibiotics for treatment and hycodan cough syrup for use at night to facilitate decreased cough and rest. Will also send treatment with prednisone burst for reduction of inflammation. She will follow up if her symptoms are not resolved in the next 10 -14 days.

## 2022-12-12 ENCOUNTER — Encounter (HOSPITAL_BASED_OUTPATIENT_CLINIC_OR_DEPARTMENT_OTHER): Payer: Self-pay | Admitting: Family Medicine

## 2022-12-12 ENCOUNTER — Ambulatory Visit (HOSPITAL_BASED_OUTPATIENT_CLINIC_OR_DEPARTMENT_OTHER): Payer: No Typology Code available for payment source | Admitting: Family Medicine

## 2022-12-12 VITALS — BP 123/81 | HR 80 | Ht 66.0 in | Wt 210.8 lb

## 2022-12-12 DIAGNOSIS — E559 Vitamin D deficiency, unspecified: Secondary | ICD-10-CM

## 2022-12-12 DIAGNOSIS — R21 Rash and other nonspecific skin eruption: Secondary | ICD-10-CM

## 2022-12-12 MED ORDER — TRIAMCINOLONE ACETONIDE 0.1 % EX CREA: TOPICAL_CREAM | Freq: Every day | CUTANEOUS | 0 refills | Status: DC

## 2022-12-12 NOTE — Progress Notes (Unsigned)
   Established Patient Office Visit  Subjective   Patient ID: Michelle Holloway, female    DOB: 09-03-1980  Age: 42 y.o. MRN: [id number]  Chief Complaint  Patient presents with   Rash    Breakout on neck, present for 2 weeks. Has tried OTC creams and benadryl. Thinks it may be possible ringworm   Vitamin D     Needs Vit D Checked has been on high dose intermittently since 04/2022   Patient is here for complaints of two small circular macular rash on her left lower neck and right anterior chest. She reports it has been there about 2 weeks and does have itchy moments. She has not been exposed to anyone with ringworm or who is sick. At first, she stated it looked more so like a rash and now it is circular.   Has been taking high dose vitamin D intermittently since 04/2022 Will check vitamin D today    Review of Systems  Constitutional:  Negative for malaise/fatigue.  Eyes:  Negative for blurred vision and double vision.  Respiratory:  Negative for cough and shortness of breath.   Cardiovascular:  Negative for chest pain and palpitations.  Gastrointestinal:  Negative for abdominal pain, nausea and vomiting.  Musculoskeletal:  Negative for myalgias.  Skin:  Positive for itching and rash (L lower neck and R anterior chest).  Neurological:  Negative for dizziness, weakness and headaches.  Psychiatric/Behavioral:  Negative for depression and suicidal ideas. The patient is not nervous/anxious.    Objective:    BP 123/81   Pulse 80   Ht  (1.676 m)   Wt 210 lb 12.8 oz (95.6 kg)   LMP 08/30/2017 (Approximate)   SpO2 100%   BMI 34.02 kg/m  BP Readings from Last 3 Encounters:  12/12/22 123/81  07/04/22 117/81  04/26/22 114/72     Physical Exam Constitutional:      Appearance: Normal appearance.  Cardiovascular:     Rate and Rhythm: Normal rate and regular rhythm.     Pulses: Normal pulses.     Heart sounds: Normal heart sounds.  Pulmonary:     Effort: Pulmonary effort is  normal.     Breath sounds: Normal breath sounds.  Skin:    General: Skin is warm.     Findings: Rash (1mm diameter) present. Rash is macular.  Neurological:     Mental Status: She is alert.  Psychiatric:        Mood and Affect: Mood normal.        Behavior: Behavior normal.     Assessment & Plan:  1. Rash of unknown etiology Patient has two small circular macular rashes (1mm in diameter) on her left lower neck and right anterior chest. She has been using over-the-counter Lomitrin with no improvement. Less likely to be ringworm. Will treat with antifungal and steroid cream combination. Advised her if she does not have improvement in 1-2 weeks, we can place dermatology referral.   2. Vitamin D deficiency Patient has a history of vitamin D deficiency and has been taking high dose (50,000 units) vitamin D intermittently since Sept 2023. Advised her to stop taking high dose vitamin D and we can recheck her levels today. Will advise her of the treatment plan once lab results are processed.  - VITAMIN D 25 Hydroxy (Vit-D Deficiency, Fractures)  Return in about 5 months (around 05/02/2023) for Physical with fasting labs.    Alyson Reedy, FNP

## 2022-12-13 DIAGNOSIS — R21 Rash and other nonspecific skin eruption: Secondary | ICD-10-CM | POA: Insufficient documentation

## 2022-12-13 LAB — VITAMIN D 25 HYDROXY (VIT D DEFICIENCY, FRACTURES): Vit D, 25-Hydroxy: 33.4 ng/mL (ref 30.0–100.0)

## 2022-12-20 ENCOUNTER — Encounter (HOSPITAL_BASED_OUTPATIENT_CLINIC_OR_DEPARTMENT_OTHER): Payer: Self-pay

## 2022-12-26 ENCOUNTER — Telehealth: Payer: No Typology Code available for payment source | Admitting: Physician Assistant

## 2022-12-26 DIAGNOSIS — N760 Acute vaginitis: Secondary | ICD-10-CM

## 2022-12-26 MED ORDER — FLUCONAZOLE 150 MG PO TABS: 150.0000 mg | ORAL_TABLET | Freq: Every day | ORAL | 0 refills | Status: AC

## 2022-12-26 NOTE — Progress Notes (Signed)

## 2023-01-31 ENCOUNTER — Telehealth (HOSPITAL_BASED_OUTPATIENT_CLINIC_OR_DEPARTMENT_OTHER): Payer: Self-pay | Admitting: Family Medicine

## 2023-01-31 NOTE — Telephone Encounter (Signed)
lvm to schedule diabetic retina screening for 02/09/23

## 2023-02-15 ENCOUNTER — Encounter (HOSPITAL_COMMUNITY): Payer: Self-pay

## 2023-02-15 ENCOUNTER — Ambulatory Visit (HOSPITAL_COMMUNITY)
Admission: EM | Admit: 2023-02-15 | Discharge: 2023-02-15 | Disposition: A | Discharge status: Home / Self Care | Payer: No Typology Code available for payment source | Attending: Emergency Medicine | Admitting: Emergency Medicine

## 2023-02-15 DIAGNOSIS — Z113 Encounter for screening for infections with a predominantly sexual mode of transmission: Secondary | ICD-10-CM | POA: Insufficient documentation

## 2023-02-15 LAB — HIV ANTIBODY (ROUTINE TESTING W REFLEX): HIV Screen 4th Generation wRfx: NONREACTIVE

## 2023-02-15 MED ORDER — FLUCONAZOLE 150 MG PO TABS: 150.0000 mg | ORAL_TABLET | Freq: Every day | ORAL | 0 refills | Status: AC

## 2023-02-15 NOTE — ED Triage Notes (Signed)
Patient here today to have STD screening. She recently found out that her husband cheated. She is not having any symptoms.

## 2023-02-15 NOTE — ED Provider Notes (Signed)
[facility]    CSN: [id number] Arrival date & time: 02/15/23  1413      History   Chief Complaint Chief Complaint  Patient presents with   STD screening    HPI Michelle Holloway is a 42 y.o. female.   Patient presents for evaluation of mild vaginal itching beginning 3 days ago.  No known exposure but recently discovered partner's infidelity.  Denies vaginal discharge, odor, abdominal pain or pressure, flank pain, urinary symptoms.    Past Medical History:  Diagnosis Date   Adenomyosis    Blood transfusion without reported diagnosis    Endometriosis    Fibroid    H/O total hysterectomy    Hyperlipidemia 08/06/2021   Infertility, female    LLQ pain 08/31/2020   Ovarian cyst    Pain of toe of left foot 08/31/2020   Prediabetes 01/28/2021   Swelling of lower extremity 01/28/2021    Patient Active Problem List   Diagnosis Date Noted   Rash of unknown etiology 12/13/2022   Acute bacterial bronchitis 08/19/2022   History of gastric bypass 04/26/2022   Encounter for medical examination to establish care 04/26/2022   Type 2 diabetes mellitus with hyperglycemia, without long-term current use of insulin (HCC) 04/26/2022   Hyperlipidemia 08/06/2021   SOB (shortness of breath) on exertion 08/06/2021   Vitamin D deficiency 08/06/2021   Severe obesity (BMI 35.0-39.9) with comorbidity (HCC) 01/28/2021   Breast pain 08/31/2020    Past Surgical History:  Procedure Laterality Date   ABDOMINAL HYSTERECTOMY N/A 05/2019   CYSTOSCOPY WITH RETROGRADE PYELOGRAM, URETEROSCOPY AND STENT PLACEMENT Right 01/20/2022   Procedure: CYSTOSCOPY WITH RETROGRADE PYELOGRAM, URETEROSCOPY AND STENT REMOVAL;  Surgeon: Malen Gauze, MD;  Location: [facility];  Service: Urology;  Laterality: Right;   DILATION AND CURETTAGE OF UTERUS     STONE EXTRACTION WITH BASKET Right 01/20/2022   Procedure: STONE EXTRACTION WITH BASKET;  Surgeon: Malen Gauze, MD;  Location: [facility];  Service: Urology;   Laterality: Right;    OB History     Gravida  5   Para      Term      Preterm      AB  5   Living  0      SAB  5   IAB      Ectopic      Multiple      Live Births               Home Medications    Prior to Admission medications   Medication Sig Start Date End Date Taking? Authorizing Provider  ketoconazole 2%-triamcinolone 0.1% 1:2 cream mixture Apply topically daily. 12/12/22   Alyson Reedy, FNP    Family History Family History  Problem Relation Age of Onset   Diabetes Mother    Hypertension Mother    Rheum arthritis Mother    Diabetes Father    Hypertension Father    Heart murmur Father    Diabetes Sister    Hypertension Sister    Rheum arthritis Sister    Diabetes Brother    Hypertension Brother    Diabetes Paternal Grandmother    Hypertension Paternal Grandmother    Brain cancer Maternal Uncle    Ovarian cancer Maternal Grandmother    Diabetes Maternal Grandmother    Hypertension Maternal Grandmother    Stroke Maternal Grandmother    Colon cancer Maternal Grandfather    Diabetes Maternal Grandfather    Hypertension Maternal Grandfather  Stroke Maternal Grandfather    Diabetes Paternal Grandfather    Hypertension Paternal Grandfather     Social History Social History   Tobacco Use   Smoking status: Never   Smokeless tobacco: Never  Vaping Use   Vaping Use: Never used  Substance Use Topics   Alcohol use: Not Currently   Drug use: Never     Allergies   Patient has no known allergies.   Review of Systems Review of Systems   Physical Exam Triage Vital Signs ED Triage Vitals [02/15/23 1449]  Enc Vitals Group     BP 132/78     Pulse Rate 78     Resp 20     Temp 98.6 F (37 C)     Temp Source Oral     SpO2 97 %     Weight 206 lb (93.4 kg)     Height 5' 6.5" (1.689 m)     Head Circumference      Peak Flow      Pain Score 0     Pain Loc      Pain Edu?      Excl. in GC?    No data found.  Updated Vital  Signs BP 132/78 (BP Location: Left Arm)   Pulse 78   Temp 98.6 F (37 C) (Oral)   Resp 20   Ht 5' 6.5" (1.689 m)   Wt 206 lb (93.4 kg)   LMP 08/30/2017 (Approximate)   SpO2 97%   BMI 32.75 kg/m   Visual Acuity Right Eye Distance:   Left Eye Distance:   Bilateral Distance:    Right Eye Near:   Left Eye Near:    Bilateral Near:     Physical Exam Constitutional:      Appearance: Normal appearance.  Eyes:     Extraocular Movements: Extraocular movements intact.  Pulmonary:     Effort: Pulmonary effort is normal.  Genitourinary:    Comments: deferred Neurological:     Mental Status: She is alert and oriented to person, place, and time. Mental status is at baseline.      UC Treatments / Results  Labs (all labs ordered are listed, but only abnormal results are displayed) Labs Reviewed  HIV ANTIBODY (ROUTINE TESTING W REFLEX)  RPR  CERVICOVAGINAL ANCILLARY ONLY    EKG   Radiology No results found.  Procedures Procedures (including critical care time)  Medications Ordered in UC Medications - No data to display  Initial Impression / Assessment and Plan / UC Course  I have reviewed the triage vital signs and the nursing notes.  Pertinent labs & imaging results that were available during my care of the patient were reviewed by me and considered in my medical decision making (see chart for details).  Routine screening for STI  Prophylactically treated for yeast, Diflucan sent to pharmacy and discussed administration, STI labs pending will treat per protocol, advised abstinence until lab results, and/or treatment is complete, advised condom use during all sexual encounters moving, may follow-up with urgent care as needed  Final Clinical Impressions(s) / UC Diagnoses   Final diagnoses:  Routine screening for STI (sexually transmitted infection)     Discharge Instructions      Labs pending 2-3 days, you will be contacted if positive for any sti and  treatment will be sent to the pharmacy, you will have to return to the clinic if positive for gonorrhea to receive treatment   Please refrain from having sex until labs results,  if positive please refrain from having sex until treatment complete and symptoms resolve   If positive for HIV, Syphilis, Chlamydia  gonorrhea or trichomoniasis please notify partner or partners so they may tested as well  Moving forward, it is recommended you use some form of protection against the transmission of sti infections  such as condoms or dental dams with each sexual encounter     ED Prescriptions   None    PDMP not reviewed this encounter.   Valinda Hoar, [place] 02/15/23 [phone]

## 2023-02-15 NOTE — Discharge Instructions (Addendum)
Today you are being treated prophylactically for yeast.   Take diflucan 150 mg once, if symptoms still present in 3 days then you may take second pill   Yeast infections which are caused by a naturally occurring fungus called candida. Vaginosis is an inflammation of the vagina that can result in discharge, itching and pain. The cause is usually a change in the normal balance of vaginal bacteria or an infection. Vaginosis can also result from reduced estrogen levels after menopause.  Labs pending 2-3 days, you will be contacted if positive for any sti and treatment will be sent to the pharmacy, you will have to return to the clinic if positive for gonorrhea to receive treatment   Please refrain from having sex until labs results, if positive please refrain from having sex until treatment complete and symptoms resolve   If positive for HIV, Syphilis, Chlamydia  gonorrhea or trichomoniasis please notify partner or partners so they may tested as well  Moving forward, it is recommended you use some form of protection against the transmission of sti infections  such as condoms or dental dams with each sexual encounter    In addition:   Avoid baths, hot tubs and whirlpool spas.  Don't use scented or harsh soaps, such as those with deodorant or antibacterial action. Avoid irritants. These include scented tampons and pads. Wipe from front to back after using the toilet.  Don't douche. Your vagina doesn't require cleansing other than normal bathing.  Use a  condom. Wear cotton underwear, this fabric helps absorb moisture   

## 2023-02-16 LAB — CERVICOVAGINAL ANCILLARY ONLY
Bacterial Vaginitis (gardnerella): NEGATIVE
Candida Glabrata: NEGATIVE
Candida Vaginitis: POSITIVE — AB
Chlamydia: NEGATIVE
Comment: NEGATIVE
Comment: NEGATIVE
Comment: NEGATIVE
Comment: NEGATIVE
Comment: NEGATIVE
Comment: NORMAL
Neisseria Gonorrhea: NEGATIVE
Trichomonas: NEGATIVE

## 2023-02-16 LAB — RPR: RPR Ser Ql: NONREACTIVE

## 2023-02-19 ENCOUNTER — Other Ambulatory Visit (HOSPITAL_BASED_OUTPATIENT_CLINIC_OR_DEPARTMENT_OTHER): Payer: Self-pay | Admitting: Family Medicine

## 2023-02-20 MED ORDER — TRIAMCINOLONE ACETONIDE 0.1 % EX CREA: TOPICAL_CREAM | Freq: Every day | CUTANEOUS | 0 refills | Status: DC

## 2023-03-29 ENCOUNTER — Encounter: Payer: Self-pay | Admitting: Obstetrics & Gynecology

## 2023-03-29 ENCOUNTER — Ambulatory Visit (INDEPENDENT_AMBULATORY_CARE_PROVIDER_SITE_OTHER): Payer: No Typology Code available for payment source | Admitting: Obstetrics & Gynecology

## 2023-03-29 VITALS — BP 115/70 | HR 78 | Ht 67.0 in | Wt 207.0 lb

## 2023-03-29 DIAGNOSIS — Z90710 Acquired absence of both cervix and uterus: Secondary | ICD-10-CM

## 2023-03-29 DIAGNOSIS — Z9884 Bariatric surgery status: Secondary | ICD-10-CM

## 2023-03-29 DIAGNOSIS — Z01419 Encounter for gynecological examination (general) (routine) without abnormal findings: Secondary | ICD-10-CM

## 2023-03-29 NOTE — Progress Notes (Signed)
New pt in office for routine GYN exam. Pt has no concerns today.   Last pap 2020

## 2023-03-29 NOTE — Progress Notes (Signed)
Patient ID: Michelle Holloway, female   DOB: 23-Dec-1980, 42 y.o.   MRN: [id number]  Chief Complaint  Patient presents with   New Patient (Initial Visit)   Annual Exam    HPI Michelle Holloway is a 42 y.o. female.  [id number] S/p T[facility] BS in [place] in 2020 for menorrhagia with adenomyosis. She had an abnormal mammogram and biopsy last year which was benign and f/u is scheduled.  HPI  Past Medical History:  Diagnosis Date   Adenomyosis    Blood transfusion without reported diagnosis    Endometriosis    Fibroid    H/O total hysterectomy    Hyperlipidemia 08/06/2021   Infertility, female    LLQ pain 08/31/2020   Ovarian cyst    Pain of toe of left foot 08/31/2020   Prediabetes 01/28/2021   Swelling of lower extremity 01/28/2021    Past Surgical History:  Procedure Laterality Date   ABDOMINAL HYSTERECTOMY N/A 05/2019   CYSTOSCOPY WITH RETROGRADE PYELOGRAM, URETEROSCOPY AND STENT PLACEMENT Right 01/20/2022   Procedure: CYSTOSCOPY WITH RETROGRADE PYELOGRAM, URETEROSCOPY AND STENT REMOVAL;  Surgeon: Malen Gauze, MD;  Location: [facility];  Service: Urology;  Laterality: Right;   DILATION AND CURETTAGE OF UTERUS     STONE EXTRACTION WITH BASKET Right 01/20/2022   Procedure: STONE EXTRACTION WITH BASKET;  Surgeon: Malen Gauze, MD;  Location: [facility];  Service: Urology;  Laterality: Right;    Family History  Problem Relation Age of Onset   Diabetes Mother    Hypertension Mother    Rheum arthritis Mother    Diabetes Father    Hypertension Father    Heart murmur Father    Diabetes Sister    Hypertension Sister    Rheum arthritis Sister    Diabetes Brother    Hypertension Brother    Diabetes Paternal Grandmother    Hypertension Paternal Grandmother    Brain cancer Maternal Uncle    Ovarian cancer Maternal Grandmother    Diabetes Maternal Grandmother    Hypertension Maternal Grandmother    Stroke Maternal Grandmother    Colon cancer Maternal Grandfather    Diabetes Maternal  Grandfather    Hypertension Maternal Grandfather    Stroke Maternal Grandfather    Diabetes Paternal Grandfather    Hypertension Paternal Grandfather     Social History Social History   Tobacco Use   Smoking status: Never   Smokeless tobacco: Never  Vaping Use   Vaping status: Never Used  Substance Use Topics   Alcohol use: Yes    Comment: social   Drug use: Never    No Known Allergies  Current Outpatient Medications  Medication Sig Dispense Refill   ketoconazole 2%-triamcinolone 0.1% 1:2 cream mixture Apply topically daily. 45 g 0   No current facility-administered medications for this visit.    Review of Systems Review of Systems  Constitutional: Negative.   Respiratory: Negative.    Cardiovascular: Negative.   Gastrointestinal: Negative.   Genitourinary: Negative.   Neurological: Negative.     Blood pressure 115/70, pulse 78, height 5\' 7"  (1.702 m), weight 207 lb (93.9 kg), last menstrual period 08/30/2017.  Physical Exam Physical Exam Vitals and nursing note reviewed. Exam conducted with a chaperone present.  Constitutional:      Appearance: Normal appearance.  HENT:     Head: Normocephalic and atraumatic.  Eyes:     Pupils: Pupils are equal, round, and reactive to light.  Cardiovascular:     Rate and Rhythm: Normal rate.  Pulmonary:  Effort: Pulmonary effort is normal.  Chest:  Breasts:    Right: Normal.     Left: Normal.  Abdominal:     General: Abdomen is flat.     Palpations: Abdomen is soft.  Musculoskeletal:     Cervical back: Normal range of motion.  Lymphadenopathy:     Upper Body:     Right upper body: No axillary adenopathy.     Left upper body: No axillary adenopathy.  Skin:    General: Skin is warm and dry.  Neurological:     General: No focal deficit present.     Mental Status: She is alert.  Psychiatric:        Mood and Affect: Mood normal.        Behavior: Behavior normal.     Data Reviewed Pap  2020  Assessment Well woman exam with routine gynecological exam - Plan: MM 3D SCREENING MAMMOGRAM BILATERAL BREAST, CANCELED: MM Digital Screening  History of abdominal hysterectomy  History of gastric bypass   Plan F/u mammogram annually Pelvic exam as indicated, deferred today    Scheryl Darter 03/29/2023, 10:03 AM

## 2023-05-02 ENCOUNTER — Ambulatory Visit: Payer: No Typology Code available for payment source

## 2023-05-04 ENCOUNTER — Ambulatory Visit
Admission: RE | Admit: 2023-05-04 | Discharge: 2023-05-04 | Disposition: A | Discharge status: Home / Self Care | Payer: No Typology Code available for payment source | Source: Ambulatory Visit | Attending: Obstetrics & Gynecology | Admitting: Obstetrics & Gynecology

## 2023-05-04 DIAGNOSIS — Z01419 Encounter for gynecological examination (general) (routine) without abnormal findings: Secondary | ICD-10-CM

## 2023-05-15 ENCOUNTER — Encounter (HOSPITAL_BASED_OUTPATIENT_CLINIC_OR_DEPARTMENT_OTHER): Payer: No Typology Code available for payment source | Admitting: Family Medicine

## 2023-05-19 ENCOUNTER — Telehealth: Payer: No Typology Code available for payment source | Admitting: Family Medicine

## 2023-05-19 DIAGNOSIS — B3731 Acute candidiasis of vulva and vagina: Secondary | ICD-10-CM

## 2023-05-19 MED ORDER — FLUCONAZOLE 150 MG PO TABS: 150.0000 mg | ORAL_TABLET | Freq: Once | ORAL | 0 refills | Status: AC

## 2023-05-19 NOTE — Progress Notes (Signed)

## 2023-05-23 ENCOUNTER — Ambulatory Visit (INDEPENDENT_AMBULATORY_CARE_PROVIDER_SITE_OTHER): Payer: No Typology Code available for payment source | Admitting: Family Medicine

## 2023-05-23 ENCOUNTER — Encounter (HOSPITAL_BASED_OUTPATIENT_CLINIC_OR_DEPARTMENT_OTHER): Payer: Self-pay | Admitting: Family Medicine

## 2023-05-23 VITALS — BP 136/86 | HR 67 | Ht 67.0 in | Wt 204.9 lb

## 2023-05-23 DIAGNOSIS — R519 Headache, unspecified: Secondary | ICD-10-CM | POA: Diagnosis not present

## 2023-05-23 DIAGNOSIS — Z0000 Encounter for general adult medical examination without abnormal findings: Secondary | ICD-10-CM | POA: Insufficient documentation

## 2023-05-23 DIAGNOSIS — E559 Vitamin D deficiency, unspecified: Secondary | ICD-10-CM | POA: Diagnosis not present

## 2023-05-23 DIAGNOSIS — R21 Rash and other nonspecific skin eruption: Secondary | ICD-10-CM | POA: Diagnosis not present

## 2023-05-23 NOTE — Progress Notes (Unsigned)
Complete physical exam  Patient: Michelle Holloway   DOB: [birth year]   42 y.o. Female  MRN: [id number]  Subjective:   Michelle Holloway is a 42 y.o. female who presents today for a complete physical exam. She reports consuming a general diet. The patient does not participate in regular exercise at present. She reports that she needs to start exercising more often. She generally feels well. She reports sleeping well. She does not have additional problems to discuss today.   Left leg- small bump on her leg that has been present for "years" Sometimes she is able to visualize it  Small, hard, size of pellet, tenderness to touch sometimes, pain in her L leg region   Random pains in her head- stays for about 2 mins- has worsened over the past 2 months.  Denies headache, worst headache of her life, slurred speech, facial weakness or drooping,  Goes from the top of her head to the lower portion of her head  Denies that she has her hair braided often but reports it does not only occur when she had her hair done.   Depression screenings:    03/29/2023    9:42 AM 07/04/2022    2:54 PM 08/06/2021    9:37 AM  Depression screen PHQ 2/9  Decreased Interest 0 0 0  Down, Depressed, Hopeless 0 0 0  PHQ - 2 Score 0 0 0  Altered sleeping  1   Tired, decreased energy  1   Change in appetite  0   Feeling bad or failure about yourself   0   Trouble concentrating  0   Moving slowly or fidgety/restless  0   Suicidal thoughts  0   PHQ-9 Score  2   Difficult doing work/chores  Not difficult at all Not difficult at all    Anxiety screenings:    07/04/2022    2:55 PM  GAD 7 : Generalized Anxiety Score  Nervous, Anxious, on Edge 0  Control/stop worrying 0  Worry too much - different things 0  Trouble relaxing 0  Restless 0  Easily annoyed or irritable 0  Afraid - awful might happen 0  Total GAD 7 Score 0  Anxiety Difficulty Not difficult at all   Vision:Within last year and Dental: No current  dental problems and Receives regular dental care Dentist- Sept 2024 Vision- Oct 2024  Patient Care Team: Alyson Reedy, FNP as PCP - General (Family Medicine)   No outpatient medications prior to visit.   No facility-administered medications prior to visit.   Review of Systems  Constitutional:  Negative for malaise/fatigue and weight loss.  HENT:  Negative for congestion.   Eyes:  Negative for blurred vision and double vision.  Respiratory:  Negative for cough and shortness of breath.   Cardiovascular:  Negative for chest pain and palpitations.  Gastrointestinal:  Negative for abdominal pain, nausea and vomiting.  Musculoskeletal:  Negative for myalgias.  Skin:  Positive for rash.  Neurological:  Negative for dizziness, weakness and headaches.  Psychiatric/Behavioral:  Negative for depression and suicidal ideas. The patient does not have insomnia.        Objective:     BP 136/86   Pulse 67   Ht 5\' 7"  (1.702 m)   Wt 204 lb 14.4 oz (92.9 kg)   LMP 08/30/2017 (Approximate)   SpO2 100%   BMI 32.09 kg/m  BP Readings from Last 3 Encounters:  05/23/23 136/86  03/29/23 115/70  02/15/23 132/78  Physical Exam Constitutional:      Appearance: Normal appearance.  HENT:     Right Ear: Tympanic membrane, ear canal and external ear normal.     Left Ear: Tympanic membrane, ear canal and external ear normal.     Mouth/Throat:     Mouth: Mucous membranes are moist.     Pharynx: Oropharynx is clear.  Eyes:     Extraocular Movements: Extraocular movements intact.     Pupils: Pupils are equal, round, and reactive to light.  Cardiovascular:     Rate and Rhythm: Normal rate and regular rhythm.     Pulses: Normal pulses.     Heart sounds: Normal heart sounds.  Pulmonary:     Effort: Pulmonary effort is normal.     Breath sounds: Normal breath sounds.  Abdominal:     General: Abdomen is flat. Bowel sounds are normal.     Palpations: Abdomen is soft.  Musculoskeletal:         General: Normal range of motion.  Skin:    General: Skin is warm and dry.     Findings: Rash present. Rash is macular.     Comments: Left side of neck   Neurological:     Mental Status: She is alert.  Psychiatric:        Mood and Affect: Mood normal.        Behavior: Behavior normal.        Thought Content: Thought content normal.        Judgment: Judgment normal.         Assessment & Plan:    Routine Health Maintenance and Physical Exam  Health Maintenance  Topic Date Due   Complete foot exam   Never done   Eye exam for diabetics  Never done   Yearly kidney health urinalysis for diabetes  Never done   DTaP/Tdap/Td vaccine (1 - Tdap) Never done   Hemoglobin A1C  10/25/2022   COVID-19 Vaccine (3 - 2023-24 season) 04/23/2023   Yearly kidney function blood test for diabetes  04/27/2023   Mammogram  05/03/2024   Hepatitis C Screening  Completed   HIV Screening  Completed   HPV Vaccine  Aged Out   Flu Shot  Discontinued    Wellness examination Assessment & Plan: Routine HCM non-fasting labs ordered. Will obtain labs today and update patient with results.  Review of PMH, FH, SH, medications and HM performed. Preventative care hand-out provided.  Recommend healthy diet.  Recommend approximately 150 minutes/week of moderate intensity exercise. Recommend regular dental and vision exams. Always use seatbelt/lap and shoulder restraints. Recommend using smoke alarms and checking batteries at least twice a year. Recommend using sunscreen when outside. Discussed immunization recommendations for tetanus vaccine. Patient declines at this time, will consider.    Orders: -     CBC with Differential/Platelet -     Comprehensive metabolic panel -     Hemoglobin A1c -     Lipid panel -     TSH Rfx on Abnormal to Free T4  Vitamin D deficiency Assessment & Plan: Patient has a history of vitamin D deficiency. Will check vitamin D levels today.   Orders: -     VITAMIN D 25  Hydroxy (Vit-D Deficiency, Fractures)  Rash of unknown etiology Assessment & Plan: Patient reports continued appearance of small circular macular rashes (1mm in diameter) on her left lower neck that resolves with prescribed combination cream. Patient would like dermatology referral.   Orders: -  Ambulatory referral to Dermatology  Acute nonintractable headache, unspecified headache type -     Ambulatory referral to Neurology     No follow-ups on file.     Alyson Reedy, FNP

## 2023-05-23 NOTE — Assessment & Plan Note (Signed)
Routine HCM non-fasting labs ordered. Will obtain labs today and update patient with results.  Review of PMH, FH, SH, medications and HM performed. Preventative care hand-out provided.  Recommend healthy diet.  Recommend approximately 150 minutes/week of moderate intensity exercise. Recommend regular dental and vision exams. Always use seatbelt/lap and shoulder restraints. Recommend using smoke alarms and checking batteries at least twice a year. Recommend using sunscreen when outside. Discussed immunization recommendations for tetanus vaccine. Patient declines at this time, will consider.

## 2023-05-23 NOTE — Assessment & Plan Note (Signed)
Patient reports continued appearance of small circular macular rashes (1mm in diameter) on her left lower neck that resolves with prescribed combination cream. Patient would like dermatology referral.

## 2023-05-23 NOTE — Assessment & Plan Note (Signed)
Patient has a history of vitamin D deficiency. Will check vitamin D levels today.

## 2023-05-24 LAB — CBC WITH DIFFERENTIAL/PLATELET
Basophils Absolute: 0 10*3/uL (ref 0.0–0.2)
Basos: 1 %
EOS (ABSOLUTE): 0.2 10*3/uL (ref 0.0–0.4)
Eos: 4 %
Hematocrit: 44.5 % (ref 34.0–46.6)
Hemoglobin: 14.4 g/dL (ref 11.1–15.9)
Immature Grans (Abs): 0 10*3/uL (ref 0.0–0.1)
Immature Granulocytes: 0 %
Lymphocytes Absolute: 3.1 10*3/uL (ref 0.7–3.1)
Lymphs: 59 %
MCH: 28.6 pg (ref 26.6–33.0)
MCHC: 32.4 g/dL (ref 31.5–35.7)
MCV: 89 fL (ref 79–97)
Monocytes Absolute: 0.4 10*3/uL (ref 0.1–0.9)
Monocytes: 8 %
Neutrophils Absolute: 1.5 10*3/uL (ref 1.4–7.0)
Neutrophils: 28 %
Platelets: 267 10*3/uL (ref 150–450)
RBC: 5.03 x10E6/uL (ref 3.77–5.28)
RDW: 12.9 % (ref 11.7–15.4)
WBC: 5.3 10*3/uL (ref 3.4–10.8)

## 2023-05-24 LAB — COMPREHENSIVE METABOLIC PANEL
ALT: 12 [IU]/L (ref 0–32)
AST: 17 [IU]/L (ref 0–40)
Albumin: 4.2 g/dL (ref 3.9–4.9)
Alkaline Phosphatase: 80 [IU]/L (ref 44–121)
BUN/Creatinine Ratio: 9 (ref 9–23)
BUN: 8 mg/dL (ref 6–24)
Bilirubin Total: 0.4 mg/dL (ref 0.0–1.2)
CO2: 24 mmol/L (ref 20–29)
Calcium: 9 mg/dL (ref 8.7–10.2)
Chloride: 102 mmol/L (ref 96–106)
Creatinine, Ser: 0.85 mg/dL (ref 0.57–1.00)
Globulin, Total: 2.6 g/dL (ref 1.5–4.5)
Glucose: 98 mg/dL (ref 70–99)
Potassium: 4.4 mmol/L (ref 3.5–5.2)
Sodium: 140 mmol/L (ref 134–144)
Total Protein: 6.8 g/dL (ref 6.0–8.5)
eGFR: 87 mL/min/{1.73_m2} (ref >59)

## 2023-05-24 LAB — TSH RFX ON ABNORMAL TO FREE T4: TSH: 2.13 u[IU]/mL (ref 0.450–4.500)

## 2023-05-24 LAB — VITAMIN D 25 HYDROXY (VIT D DEFICIENCY, FRACTURES): Vit D, 25-Hydroxy: 21.8 ng/mL — ABNORMAL LOW (ref 30.0–100.0)

## 2023-05-24 LAB — LIPID PANEL
Chol/HDL Ratio: 2.5 {ratio} (ref 0.0–4.4)
Cholesterol, Total: 224 mg/dL — ABNORMAL HIGH (ref 100–199)
HDL: 89 mg/dL (ref >39)
LDL Chol Calc (NIH): 124 mg/dL — ABNORMAL HIGH (ref 0–99)
Triglycerides: 64 mg/dL (ref 0–149)
VLDL Cholesterol Cal: 11 mg/dL (ref 5–40)

## 2023-05-24 LAB — HEMOGLOBIN A1C
Est. average glucose Bld gHb Est-mCnc: 120 mg/dL
Hgb A1c MFr Bld: 5.8 % — ABNORMAL HIGH (ref 4.8–5.6)

## 2023-05-24 NOTE — Assessment & Plan Note (Addendum)
Patient presents with acute "head pain" on the top of her right scalp down to her ear. Denies being related to hair braiding, as she does not have her hair braided often. Denies thunderclap headache, worst headache of life, worsening with bending over/straining, headache upon awakening, N/V, change in vision, dizziness, syncopal episodes. Less likely related to migraines. No red flags upon exam. Patient would prefer referral to neurology.

## 2023-05-25 ENCOUNTER — Encounter: Payer: Self-pay | Admitting: Dermatology

## 2023-05-25 ENCOUNTER — Ambulatory Visit: Payer: No Typology Code available for payment source | Admitting: Dermatology

## 2023-05-25 VITALS — BP 112/73

## 2023-05-25 DIAGNOSIS — L230 Allergic contact dermatitis due to metals: Secondary | ICD-10-CM | POA: Diagnosis not present

## 2023-05-25 NOTE — Progress Notes (Signed)
   New Patient Visit   Subjective  Michelle Holloway is a 42 y.o. female who presents for the following: check dark areas neck, started 11/2022, itchy, was given a script for Ketoconazole 2%/[facility] 0.1% cr mix that helps with itching, no hx of eczema The patient has spots, moles and lesions to be evaluated, some may be new or changing and the patient may have concern these could be cancer.  New patient referral from Alyson Reedy, FNP.  The following portions of the chart were reviewed this encounter and updated as appropriate: medications, allergies, medical history  Review of Systems:  No other skin or systemic complaints except as noted in HPI or Assessment and Plan.  Objective  Well appearing patient in no apparent distress; mood and affect are within normal limits.   A focused examination was performed of the following areas: neck  Relevant exam findings are noted in the Assessment and Plan.       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  I, Ardis Rowan, RMA, am acting as scribe for Elie Goody, MD .   Documentation: I have reviewed the above documentation for accuracy and completeness, and I agree with the above.  Elie Goody, MD

## 2023-05-25 NOTE — Patient Instructions (Signed)

## 2023-07-13 ENCOUNTER — Encounter (HOSPITAL_BASED_OUTPATIENT_CLINIC_OR_DEPARTMENT_OTHER): Payer: Self-pay | Admitting: Family Medicine

## 2023-07-31 ENCOUNTER — Encounter: Payer: Self-pay | Admitting: Neurology

## 2023-07-31 ENCOUNTER — Ambulatory Visit: Payer: Self-pay | Admitting: Neurology

## 2023-09-04 ENCOUNTER — Ambulatory Visit: Payer: No Typology Code available for payment source | Admitting: Dermatology

## 2023-09-14 ENCOUNTER — Telehealth: Payer: No Typology Code available for payment source | Admitting: Physician Assistant

## 2023-09-14 DIAGNOSIS — B3731 Acute candidiasis of vulva and vagina: Secondary | ICD-10-CM

## 2023-09-14 MED ORDER — FLUCONAZOLE 150 MG PO TABS: ORAL_TABLET | ORAL | 0 refills | Status: DC

## 2023-09-14 NOTE — Progress Notes (Signed)
I have spent 5 minutes in review of e-visit questionnaire, review and updating patient chart, medical decision making and response to patient.   Mia Milan Cody Jacklynn Dehaas, PA-C    

## 2023-09-14 NOTE — Progress Notes (Signed)

## 2023-11-01 ENCOUNTER — Encounter (HOSPITAL_BASED_OUTPATIENT_CLINIC_OR_DEPARTMENT_OTHER): Admitting: Family Medicine

## 2023-11-01 ENCOUNTER — Encounter: Admitting: Family Medicine

## 2023-11-01 ENCOUNTER — Encounter (HOSPITAL_BASED_OUTPATIENT_CLINIC_OR_DEPARTMENT_OTHER): Payer: Self-pay | Admitting: *Deleted

## 2023-11-01 ENCOUNTER — Other Ambulatory Visit (HOSPITAL_COMMUNITY)
Admission: RE | Admit: 2023-11-01 | Discharge: 2023-11-01 | Disposition: A | Discharge status: Home / Self Care | Source: Ambulatory Visit | Attending: Family Medicine | Admitting: Family Medicine

## 2023-11-01 ENCOUNTER — Ambulatory Visit (HOSPITAL_BASED_OUTPATIENT_CLINIC_OR_DEPARTMENT_OTHER): Admitting: Family Medicine

## 2023-11-01 ENCOUNTER — Encounter (HOSPITAL_BASED_OUTPATIENT_CLINIC_OR_DEPARTMENT_OTHER): Payer: Self-pay | Admitting: Family Medicine

## 2023-11-01 VITALS — BP 115/82 | HR 73 | Ht 66.0 in | Wt 214.8 lb

## 2023-11-01 DIAGNOSIS — Z202 Contact with and (suspected) exposure to infections with a predominantly sexual mode of transmission: Secondary | ICD-10-CM | POA: Diagnosis present

## 2023-11-01 DIAGNOSIS — Z1159 Encounter for screening for other viral diseases: Secondary | ICD-10-CM

## 2023-11-01 DIAGNOSIS — E559 Vitamin D deficiency, unspecified: Secondary | ICD-10-CM

## 2023-11-01 DIAGNOSIS — E785 Hyperlipidemia, unspecified: Secondary | ICD-10-CM | POA: Diagnosis not present

## 2023-11-01 DIAGNOSIS — M7989 Other specified soft tissue disorders: Secondary | ICD-10-CM

## 2023-11-01 DIAGNOSIS — T148XXA Other injury of unspecified body region, initial encounter: Secondary | ICD-10-CM

## 2023-11-01 DIAGNOSIS — E1165 Type 2 diabetes mellitus with hyperglycemia: Secondary | ICD-10-CM

## 2023-11-01 DIAGNOSIS — Z114 Encounter for screening for human immunodeficiency virus [HIV]: Secondary | ICD-10-CM

## 2023-11-01 NOTE — Patient Instructions (Signed)
 Please return for your labs within the week when fasting.

## 2023-11-01 NOTE — Progress Notes (Signed)
 Subjective:   Michelle Holloway [birth year] 11/01/2023  Chief Complaint  Patient presents with   Annual Exam    Annual exam has boil on inside of left leg it has been there for awhile but it comes and goes and would like to know why she has random bruises on leg     HPI: Michelle Holloway presents today for re-assessment and management of chronic medical conditions.  DIABETES MELLITUS: Michelle Holloway presents for the medical management of diabetes.  Current diabetes medication regimen: Diet, Exercise Patient is  adhering to a diabetic diet.  Patient is  exercising regularly.  Patient is not checking BS regularly.  Patient is  checking their feet regularly.  Denies polydipsia, polyphagia, polyuria, open wounds or ulcers on feet.   Lab Results  Component Value Date   HGBA1C 5.8 (H) 05/23/2023    Foot Exam: 11/01/2023 Lab Results  Component Value Date   LABMICR See below: 01/10/2022    Wt Readings from Last 3 Encounters:  11/01/23 214 lb 12.8 oz (97.4 kg)  05/23/23 204 lb 14.4 oz (92.9 kg)  03/29/23 207 lb (93.9 kg)    HYPERLIPIDEMIA: Michelle Holloway presents for the medical management of hyperlipidemia.  Patient's current HLD regimen is: Diet, exercise Patient is not currently taking prescribed medications for HLD.  Adhering to heathy diet: Yes Exercising regularly: Yes Denies myalgias.  Lab Results  Component Value Date   CHOL 224 (H) 05/23/2023   HDL 89 05/23/2023   LDLCALC 124 (H) 05/23/2023   TRIG 64 05/23/2023   CHOLHDL 2.5 05/23/2023    VITAMIN D DEFICIENCY: Michelle Holloway presents for the medical management of Vitamin D deficiency.  Current regimen: Vitamin D3 5000 units daily (not super consistent per patient) Up to date DEXA: N/a due to age Denies recent falls or injury.  Last vitamin D Lab Results  Component Value Date   VD25OH 21.8 (L) 05/23/2023    SKIN CONCERN:  Patient reports bump on left leg present for several years. She states she  has noticed some radiating pain with palpation. Denies redness, drainage, or bleeding. Reports mild warm sensation at times with palpation. Patient states she has been having significant bruising to her lower extremities. Denies acute bleeding, prolonged bleeding or signs of GI bleeding. Denies family hx of coagulopathies.   STD SCREENING: Michelle Holloway presents for STD screening. She is concerned about thick vaginal discharge and would like testing for BV, yeast.  Denies vaginal itching.   Sexual activity:  In a Monogamous Relationship Contraception: no Recent unprotected intercourse: no Recent known exposure to STD's: no History of sexually transmitted diseases: no  Genital lesions: no Genital discharge: no Dysuria: no Swollen lymph nodes: no Fevers: no Rash: no     The following portions of the patient's history were reviewed and updated as appropriate: past medical history, past surgical history, family history, social history, allergies, medications, and problem list.   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   Hyperlipidemia 08/06/2021   Infertility, female  LLQ pain 08/31/2020   Ovarian cyst    Pain of toe of left foot 08/31/2020   Prediabetes 01/28/2021   Severe obesity (BMI 35.0-39.9) with comorbidity (HCC) 01/28/2021   Swelling of lower extremity 01/28/2021   Past Surgical History:  Procedure Laterality Date   ABDOMINAL HYSTERECTOMY N/A 05/2019   CYSTOSCOPY WITH RETROGRADE PYELOGRAM, URETEROSCOPY  AND STENT PLACEMENT Right 01/20/2022   Procedure: CYSTOSCOPY WITH RETROGRADE PYELOGRAM, URETEROSCOPY AND STENT REMOVAL;  Surgeon: Malen Gauze, MD;  Location: [facility];  Service: Urology;  Laterality: Right;   DILATION AND CURETTAGE OF UTERUS     STONE EXTRACTION WITH BASKET Right 01/20/2022   Procedure: STONE EXTRACTION WITH BASKET;  Surgeon: Malen Gauze, MD;  Location: [facility];  Service: Urology;  Laterality: Right;   Family History  Problem Relation Age of Onset   Diabetes Mother    Hypertension Mother    Rheum arthritis Mother    Diabetes Father    Hypertension Father    Heart murmur Father    Diabetes Sister    Hypertension Sister    Rheum arthritis Sister    Diabetes Brother    Hypertension Brother    Ovarian cancer Maternal Grandmother    Diabetes Maternal Grandmother    Hypertension Maternal Grandmother    Stroke Maternal Grandmother    Colon cancer Maternal Grandfather    Diabetes Maternal Grandfather    Hypertension Maternal Grandfather    Stroke Maternal Grandfather    Diabetes Paternal Grandmother    Hypertension Paternal Grandmother    Diabetes Paternal Grandfather    Hypertension Paternal Grandfather    Alzheimer's disease Paternal Grandfather    Brain cancer Maternal Uncle    Outpatient Medications Prior to Visit  Medication Sig Dispense Refill   fluconazole (DIFLUCAN) 150 MG tablet Take 1 tablet PO once. Repeat in 3 days if needed. (Patient not taking: Reported on 11/01/2023) 2 tablet 0   No facility-administered medications prior to visit.   No Known Allergies   ROS: A complete ROS was performed with pertinent positives/negatives noted in the HPI. The remainder of the ROS are negative.    Objective:   Today's Vitals   11/01/23 1341  BP: 115/82  Pulse: 73  SpO2: 98%  Weight: 214 lb 12.8 oz (97.4 kg)  Height: 5\' 6"  (1.676 m)  PainSc: 0-No pain    Physical Exam          GENERAL: Well-appearing, in NAD. Well nourished.  SKIN: Pink,  warm and dry. Small mobile palpable soft tissue mass approx. 0.25cm present to left medial thigh.  Head: Normocephalic. NECK: Trachea midline. Full ROM w/o pain or tenderness. No lymphadenopathy.  EARS: Tympanic membranes are intact, translucent without bulging and without drainage. Appropriate landmarks visualized.  EYES: Conjunctiva clear without exudates. EOMI, PERRL, no drainage present.  NOSE: Septum midline w/o deformity. Nares patent, mucosa pink and non-inflamed w/o drainage. No sinus tenderness.  THROAT: Uvula midline. Oropharynx clear. Tonsils non-inflamed without exudate. Mucous membranes pink and moist.  RESPIRATORY: Chest wall symmetrical. Respirations even and non-labored. Breath sounds clear to auscultation bilaterally.  CARDIAC: S1, S2 present, regular rate and rhythm without murmur or gallops. Peripheral pulses 2+ bilaterally.  MSK: Muscle tone and strength appropriate for age. Joints w/o tenderness, redness, or swelling.  EXTREMITIES: Without clubbing, cyanosis, or edema.  NEUROLOGIC: No motor or sensory deficits. Steady, even gait. C2-C12 intact.  PSYCH/MENTAL STATUS: Alert, oriented x 3. Cooperative, appropriate mood and affect.   Diabetic Foot Exam - Simple  Simple Foot Form Diabetic Foot exam was performed with the following findings: Yes 11/01/2023  2:50 PM  Visual Inspection No deformities, no ulcerations, no other skin breakdown bilaterally: Yes Sensation Testing Intact to touch and monofilament testing bilaterally: Yes Pulse Check Posterior Tibialis and Dorsalis pulse intact bilaterally: Yes Comments    Health Maintenance Due  Topic Date Due   OPHTHALMOLOGY EXAM  Never done   Diabetic kidney evaluation - Urine ACR  Never done   DTaP/Tdap/Td (1 - Tdap) Never done    No results found for any visits on 11/01/23.  The 10-year ASCVD risk score (Arnett DK, et al., 2019) is: 0.6%     Assessment & Plan:  1. Type 2 diabetes mellitus with hyperglycemia, without  long-term current use of insulin (HCC) (Primary) Currently controlling with diet and exercise.  Will obtain A1c, BMP when fasting.  Patient also complete urine albumin upon return for lab work.  Foot exam completed today.  Discussed good healthy lifestyle and nutrition for diabetes control. - Basic metabolic panel; Future - Hemoglobin A1c; Future - Urine Microalbumin w/creat. ratio; Future  2. Hyperlipidemia, unspecified hyperlipidemia type Controlled with diet and exercise.  Will assess with a fasting lipid panel within the next week. - Lipid panel; Future  3. Soft tissue mass Possible calcification versus small lipoma.  Will refer to dermatology for further workup and possible excision if needed. - Ambulatory referral to Dermatology  4. Bruising Discussed loss of collagen with age, possible vitamin deficiencies and signs and symptoms of anemia with patient.  Will rule out possible thrombocytopenia and anemia with lab work. - CBC with Differential/Platelet; Future  5. Vitamin D deficiency Patient currently using OTC supplement vitamin D3 5000 units daily.  Will assess level of deficiency with vitamin D lab work. - VITAMIN D 25 Hydroxy (Vit-D Deficiency, Fractures); Future  6. Encounter for hepatitis C screening test for low risk patient Asymptomatic, patient requested screening. - Hepatitis C antibody; Future  7. Encounter for screening for HIV Asymptomatic, patient requested screening - HIV Antibody (routine testing w rflx); Future  8. Encounter for assessment of sexually transmitted disease exposure Asymptomatic, patient requested screening.  Self swab obtained today in office. - RPR; Future - Cervicovaginal ancillary only   No orders of the defined types were placed in this encounter.  Lab Orders         Basic metabolic panel         Lipid panel         Hemoglobin A1c         VITAMIN D 25 Hydroxy (Vit-D Deficiency, Fractures)         Hepatitis C antibody         HIV  Antibody (routine testing w rflx)         RPR         Urine Microalbumin w/creat. ratio         CBC with Differential/Platelet      Return in about 7 months (around 06/02/2024) for ANNUAL PHYSICAL, DIABETES CHECK UP.    Patient to reach out to office if new, worrisome, or unresolved symptoms arise or if no improvement in patient's condition. Patient verbalized understanding and is agreeable to treatment plan. All questions answered to patient's satisfaction.    Hilbert Bible, [place]

## 2023-11-02 ENCOUNTER — Encounter (HOSPITAL_BASED_OUTPATIENT_CLINIC_OR_DEPARTMENT_OTHER): Payer: Self-pay | Admitting: Family Medicine

## 2023-11-02 ENCOUNTER — Other Ambulatory Visit (HOSPITAL_BASED_OUTPATIENT_CLINIC_OR_DEPARTMENT_OTHER): Payer: Self-pay | Admitting: Family Medicine

## 2023-11-02 DIAGNOSIS — B9689 Other specified bacterial agents as the cause of diseases classified elsewhere: Secondary | ICD-10-CM | POA: Insufficient documentation

## 2023-11-02 LAB — CERVICOVAGINAL ANCILLARY ONLY
Bacterial Vaginitis (gardnerella): POSITIVE — AB
Candida Glabrata: NEGATIVE
Candida Vaginitis: NEGATIVE
Chlamydia: NEGATIVE
Comment: NEGATIVE
Comment: NEGATIVE
Comment: NEGATIVE
Comment: NEGATIVE
Comment: NEGATIVE
Comment: NORMAL
Neisseria Gonorrhea: NEGATIVE
Trichomonas: NEGATIVE

## 2023-11-02 MED ORDER — METRONIDAZOLE 500 MG PO TABS: 500.0000 mg | ORAL_TABLET | Freq: Two times a day (BID) | ORAL | 0 refills | Status: DC

## 2023-11-02 NOTE — Progress Notes (Signed)
Your vaginal swab was positive for BV (bacterial vaginosis). I am sending in an antibiotic for you (metronidazole); do not use alcohol 24 hours before, or 72 hours after taking these antibiotics. This is not a sexually transmitted disease- no need to treat partners, however condoms may reduce the risk of recurrent BV. Additional recommendations: avoid douching, consider using sanitary napkins instead of tampons, choose all-cotton underwear, and avoid wearing tight/constrictive clothing.

## 2023-11-13 ENCOUNTER — Other Ambulatory Visit (HOSPITAL_BASED_OUTPATIENT_CLINIC_OR_DEPARTMENT_OTHER): Payer: Self-pay | Admitting: *Deleted

## 2023-11-13 DIAGNOSIS — T148XXA Other injury of unspecified body region, initial encounter: Secondary | ICD-10-CM

## 2023-11-13 DIAGNOSIS — Z1159 Encounter for screening for other viral diseases: Secondary | ICD-10-CM

## 2023-11-13 DIAGNOSIS — E785 Hyperlipidemia, unspecified: Secondary | ICD-10-CM

## 2023-11-13 DIAGNOSIS — E559 Vitamin D deficiency, unspecified: Secondary | ICD-10-CM

## 2023-11-13 DIAGNOSIS — Z114 Encounter for screening for human immunodeficiency virus [HIV]: Secondary | ICD-10-CM

## 2023-11-13 DIAGNOSIS — Z202 Contact with and (suspected) exposure to infections with a predominantly sexual mode of transmission: Secondary | ICD-10-CM

## 2023-11-13 DIAGNOSIS — E1165 Type 2 diabetes mellitus with hyperglycemia: Secondary | ICD-10-CM

## 2023-11-14 LAB — LIPID PANEL
Chol/HDL Ratio: 2.6 ratio (ref 0.0–4.4)
Cholesterol, Total: 215 mg/dL — ABNORMAL HIGH (ref 100–199)
HDL: 84 mg/dL (ref >39)
LDL Chol Calc (NIH): 114 mg/dL — ABNORMAL HIGH (ref 0–99)
Triglycerides: 96 mg/dL (ref 0–149)
VLDL Cholesterol Cal: 17 mg/dL (ref 5–40)

## 2023-11-14 LAB — CBC WITH DIFFERENTIAL/PLATELET
Basophils Absolute: 0 10*3/uL (ref 0.0–0.2)
Basos: 0 %
EOS (ABSOLUTE): 0.2 10*3/uL (ref 0.0–0.4)
Eos: 4 %
Hematocrit: 45.2 % (ref 34.0–46.6)
Hemoglobin: 14.5 g/dL (ref 11.1–15.9)
Immature Grans (Abs): 0 10*3/uL (ref 0.0–0.1)
Immature Granulocytes: 0 %
Lymphocytes Absolute: 2.8 10*3/uL (ref 0.7–3.1)
Lymphs: 63 %
MCH: 28.1 pg (ref 26.6–33.0)
MCHC: 32.1 g/dL (ref 31.5–35.7)
MCV: 88 fL (ref 79–97)
Monocytes Absolute: 0.4 10*3/uL (ref 0.1–0.9)
Monocytes: 9 %
Neutrophils Absolute: 1.1 10*3/uL — ABNORMAL LOW (ref 1.4–7.0)
Neutrophils: 24 %
Platelets: 251 10*3/uL (ref 150–450)
RBC: 5.16 x10E6/uL (ref 3.77–5.28)
RDW: 13.2 % (ref 11.7–15.4)
WBC: 4.5 10*3/uL (ref 3.4–10.8)

## 2023-11-14 LAB — BASIC METABOLIC PANEL
BUN/Creatinine Ratio: 13 (ref 9–23)
BUN: 12 mg/dL (ref 6–24)
CO2: 19 mmol/L — ABNORMAL LOW (ref 20–29)
Calcium: 9 mg/dL (ref 8.7–10.2)
Chloride: 102 mmol/L (ref 96–106)
Creatinine, Ser: 0.89 mg/dL (ref 0.57–1.00)
Glucose: 113 mg/dL — ABNORMAL HIGH (ref 70–99)
Potassium: 4.2 mmol/L (ref 3.5–5.2)
Sodium: 138 mmol/L (ref 134–144)
eGFR: 82 mL/min/{1.73_m2} (ref >59)

## 2023-11-14 LAB — HIV ANTIBODY (ROUTINE TESTING W REFLEX): HIV Screen 4th Generation wRfx: NONREACTIVE

## 2023-11-14 LAB — HEPATITIS C ANTIBODY: Hep C Virus Ab: NONREACTIVE

## 2023-11-14 LAB — HEMOGLOBIN A1C
Est. average glucose Bld gHb Est-mCnc: 117 mg/dL
Hgb A1c MFr Bld: 5.7 % — ABNORMAL HIGH (ref 4.8–5.6)

## 2023-11-14 LAB — RPR: RPR Ser Ql: NONREACTIVE

## 2023-11-14 LAB — VITAMIN D 25 HYDROXY (VIT D DEFICIENCY, FRACTURES): Vit D, 25-Hydroxy: 23.7 ng/mL — ABNORMAL LOW (ref 30.0–100.0)

## 2023-11-15 ENCOUNTER — Encounter (HOSPITAL_BASED_OUTPATIENT_CLINIC_OR_DEPARTMENT_OTHER): Payer: Self-pay | Admitting: Family Medicine

## 2023-11-15 NOTE — Progress Notes (Signed)
 Hi Michelle Holloway Your vitamin D has slightly improved from 5 months ago.  I would recommend continuing the current supplement.  Your A1c is stable at 5.7 and you can continue with diet and exercise for control.  Your cholesterol has slightly improved from 6 months ago as well and we can continue on diet and exercise and lifestyle changes.  Your electrolytes and kidney function is stable.  Your blood counts are normal without signs of anemia.  Your STD testing was normal.  We will plan to recheck levels at your next appointment.

## 2024-01-22 ENCOUNTER — Ambulatory Visit (INDEPENDENT_AMBULATORY_CARE_PROVIDER_SITE_OTHER): Admitting: Physician Assistant

## 2024-01-22 ENCOUNTER — Ambulatory Visit
Admission: RE | Admit: 2024-01-22 | Discharge: 2024-01-22 | Disposition: A | Discharge status: Home / Self Care | Source: Ambulatory Visit | Attending: Physician Assistant | Admitting: Physician Assistant

## 2024-01-22 ENCOUNTER — Encounter: Payer: Self-pay | Admitting: Physician Assistant

## 2024-01-22 VITALS — BP 111/73

## 2024-01-22 DIAGNOSIS — D492 Neoplasm of unspecified behavior of bone, soft tissue, and skin: Secondary | ICD-10-CM

## 2024-01-22 DIAGNOSIS — D485 Neoplasm of uncertain behavior of skin: Secondary | ICD-10-CM

## 2024-01-22 NOTE — Patient Instructions (Signed)

## 2024-01-22 NOTE — Progress Notes (Signed)
   New Patient Visit   Subjective  Michelle Holloway is a 43 y.o. female who presents for the following: Small "boil" of left thigh x a few years. It is sometime sore and is more raised at times. The pain started about a year ago and radiates down her leg.    The following portions of the chart were reviewed this encounter and updated as appropriate: medications, allergies, medical history  Review of Systems:  No other skin or systemic complaints except as noted in HPI or Assessment and Plan.  Objective  Well appearing patient in no apparent distress; mood and affect are within normal limits.   A focused examination was performed of the following areas:   Relevant exam findings are noted in the Assessment and Plan.  Left Thigh 0.6 cm SQ papule   Assessment & Plan     NEOPLASM OF UNCERTAIN BEHAVIOR OF SKIN Left Thigh US  LT LOWER EXTREM LTD SOFT TISSUE NON VASCULAR Pilomatricoma vs Varicosity vs other  We will order an ultrasound of the area. Discussed excision (if benign, recommend just observation) vs referral to Vascular if + varicosity.  Return if symptoms worsen or fail to improve.  I, Eliot Guernsey, CMA, am acting as scribe for Keimon Basaldua K, PA-C .   Documentation: I have reviewed the above documentation for accuracy and completeness, and I agree with the above.  Ayn Domangue K, PA-C

## 2024-01-30 ENCOUNTER — Ambulatory Visit: Payer: Self-pay | Admitting: Physician Assistant

## 2024-01-30 DIAGNOSIS — T7840XA Allergy, unspecified, initial encounter: Secondary | ICD-10-CM

## 2024-01-31 ENCOUNTER — Ambulatory Visit (INDEPENDENT_AMBULATORY_CARE_PROVIDER_SITE_OTHER): Payer: Self-pay | Admitting: Obstetrics and Gynecology

## 2024-01-31 VITALS — BP 114/76 | HR 84 | Wt 205.1 lb

## 2024-01-31 DIAGNOSIS — Z1231 Encounter for screening mammogram for malignant neoplasm of breast: Secondary | ICD-10-CM

## 2024-01-31 DIAGNOSIS — N898 Other specified noninflammatory disorders of vagina: Secondary | ICD-10-CM

## 2024-01-31 DIAGNOSIS — Z1211 Encounter for screening for malignant neoplasm of colon: Secondary | ICD-10-CM

## 2024-01-31 MED ORDER — ALOE VESTA 2-N-1 PROTECTIVE EX OINT: TOPICAL_OINTMENT | Freq: Two times a day (BID) | CUTANEOUS | 0 refills | Status: DC | PRN

## 2024-01-31 NOTE — Progress Notes (Signed)
   Acute Office Visit  Subjective:    Patient ID: Michelle Holloway, female    DOB: 07/09/1981, 43 y.o.   MRN: [id number]   HPI 43 y.o. presents today for Vaginitis (Complains of possible reaction from soaps, vulvar irritation--better after cleaning with water  only.  Also with left labial bump increased in size over the last 6 months. ) .  Patient's last menstrual period was 08/30/2017 (approximate).    Review of Systems     Objective:     OBGyn Exam  BP 114/76 (BP Location: Left Arm, Patient Position: Sitting, Cuff Size: Large)   Pulse 84   Wt 205 lb 1.6 oz (93 kg)   LMP 08/30/2017 (Approximate)   SpO2 99%   BMI 33.10 kg/m  Wt Readings from Last 3 Encounters:  01/31/24 205 lb 1.6 oz (93 kg)  11/01/23 214 lb 12.8 oz (97.4 kg)  05/23/23 204 lb 14.4 oz (92.9 kg)        Joy, CMA was present for the exam and procedure Simple 1mm vaginal cyst seen on introitus on left side Consent obtained for I&D SVE: cuff intact with  Procedure: Incision and Drainage Patient consented for the procedure with written consent.  Time out performed. The area was cleaned with betadine.  1 cc of lidocaine  with epi was injected subcutaneously.  A 1 mm incision was made with the scalpel and opened and clear fluid drained. Patient with good hemostasis.  Patient tolerated the procedure well.  Post care instructions were discussed with patient.     Assessment & Plan:  Vaginal cyst Hysterectomy for adenomyosis Vaginal discharge Allergy to soap(changed ingredients after several years and had a reaction  Patient desired I&D today. Post care instructions discussed with patient. Rtc with any concerns.  Nu swab sent RTC for annual exam Referral to allergy testing  Reinaldo Caras

## 2024-02-01 LAB — SURESWAB® ADVANCED VAGINITIS PLUS,TMA
C. trachomatis RNA, TMA: NOT DETECTED
CANDIDA SPECIES: DETECTED — AB
Candida glabrata: NOT DETECTED
N. gonorrhoeae RNA, TMA: NOT DETECTED
SURESWAB(R) ADV BACTERIAL VAGINOSIS(BV),TMA: POSITIVE — AB
TRICHOMONAS VAGINALIS (TV),TMA: NOT DETECTED

## 2024-02-02 ENCOUNTER — Ambulatory Visit: Payer: Self-pay | Admitting: Obstetrics and Gynecology

## 2024-02-02 MED ORDER — FLUCONAZOLE 150 MG PO TABS
150.0000 mg | ORAL_TABLET | Freq: Once | ORAL | 0 refills | Status: AC
Start: 2024-02-02 — End: 2024-02-02

## 2024-02-02 MED ORDER — METRONIDAZOLE 500 MG PO TABS: 500.0000 mg | ORAL_TABLET | Freq: Two times a day (BID) | ORAL | 0 refills | Status: DC

## 2024-03-19 ENCOUNTER — Telehealth: Admitting: Physician Assistant

## 2024-03-19 DIAGNOSIS — B379 Candidiasis, unspecified: Secondary | ICD-10-CM

## 2024-03-19 DIAGNOSIS — T3695XA Adverse effect of unspecified systemic antibiotic, initial encounter: Secondary | ICD-10-CM | POA: Diagnosis not present

## 2024-03-19 MED ORDER — FLUCONAZOLE 150 MG PO TABS: ORAL_TABLET | ORAL | 0 refills | Status: DC

## 2024-03-19 NOTE — Progress Notes (Signed)

## 2024-03-19 NOTE — Progress Notes (Signed)
 I have spent 5 minutes in review of e-visit questionnaire, review and updating patient chart, medical decision making and response to patient.   Piedad Climes, PA-C

## 2024-03-21 ENCOUNTER — Encounter: Payer: Self-pay | Admitting: Dermatology

## 2024-04-02 ENCOUNTER — Encounter: Payer: Self-pay | Admitting: Gastroenterology

## 2024-04-03 ENCOUNTER — Encounter: Payer: Self-pay | Admitting: Obstetrics and Gynecology

## 2024-04-03 ENCOUNTER — Ambulatory Visit (INDEPENDENT_AMBULATORY_CARE_PROVIDER_SITE_OTHER): Payer: Self-pay | Admitting: Obstetrics and Gynecology

## 2024-04-03 ENCOUNTER — Other Ambulatory Visit (HOSPITAL_COMMUNITY)
Admission: RE | Admit: 2024-04-03 | Discharge: 2024-04-03 | Disposition: A | Discharge status: Home / Self Care | Source: Ambulatory Visit | Attending: Radiology | Admitting: Radiology

## 2024-04-03 VITALS — BP 128/76 | HR 73 | Ht 65.0 in | Wt 199.4 lb

## 2024-04-03 DIAGNOSIS — Z01419 Encounter for gynecological examination (general) (routine) without abnormal findings: Secondary | ICD-10-CM | POA: Insufficient documentation

## 2024-04-03 DIAGNOSIS — B9689 Other specified bacterial agents as the cause of diseases classified elsewhere: Secondary | ICD-10-CM

## 2024-04-03 DIAGNOSIS — N760 Acute vaginitis: Secondary | ICD-10-CM

## 2024-04-03 DIAGNOSIS — Z113 Encounter for screening for infections with a predominantly sexual mode of transmission: Secondary | ICD-10-CM | POA: Diagnosis not present

## 2024-04-03 DIAGNOSIS — E049 Nontoxic goiter, unspecified: Secondary | ICD-10-CM

## 2024-04-03 DIAGNOSIS — Z1231 Encounter for screening mammogram for malignant neoplasm of breast: Secondary | ICD-10-CM

## 2024-04-03 DIAGNOSIS — Z1211 Encounter for screening for malignant neoplasm of colon: Secondary | ICD-10-CM

## 2024-04-03 MED ORDER — PHEXXI 1.8-1-0.4 % VA GEL
1.0000 | Freq: Once | VAGINAL | 6 refills | Status: AC
Start: 2024-04-03 — End: 2024-04-03

## 2024-04-03 NOTE — Progress Notes (Signed)
 43 y.o. y.o. female here for annual exam. Divorced with new partner for a year Has noticed more BV and yeast infections and the PH is off after intercourse Would like phexxi  for birth control and ph control  Patient's last menstrual period was 08/30/2017 (approximate).    [id number] with Patient's last menstrual period was 08/30/2017 (approximate).   here for 10 day follow up of lower right abdominal pain.    H/O TAH/BS in 10/20. She was seen a few weeks ago with c/o deep dyspareunia.  Ultrasound showed a complex cyst on the left ovary that was c/w a hemorrhagic CL, normal right ovary.   Screening:  MMG 05/05/23 needs diagnostic due to having breast pain as well Recurrent bv/yeast: m. Urea sent  There is no height or weight on file to calculate BMI.    Last menstrual period 08/30/2017.  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   Hyperlipidemia 08/06/2021   Infertility, female    LLQ pain 08/31/2020   Ovarian cyst    Pain of toe of left foot 08/31/2020   Prediabetes 01/28/2021   Severe obesity (BMI 35.0-39.9) with comorbidity (HCC) 01/28/2021   Swelling of lower extremity 01/28/2021    Past Surgical History:  Procedure Laterality Date   ABDOMINAL HYSTERECTOMY N/A 05/2019   CYSTOSCOPY WITH RETROGRADE PYELOGRAM, URETEROSCOPY AND STENT PLACEMENT Right 01/20/2022   Procedure: CYSTOSCOPY WITH RETROGRADE PYELOGRAM, URETEROSCOPY AND STENT REMOVAL;   Surgeon: Sherrilee Belvie CROME, MD;  Location: [facility];  Service: Urology;  Laterality: Right;   DILATION AND CURETTAGE OF UTERUS     STONE EXTRACTION WITH BASKET Right 01/20/2022   Procedure: STONE EXTRACTION WITH BASKET;  Surgeon: Sherrilee Belvie CROME, MD;  Location: [facility];  Service: Urology;  Laterality: Right;    Current Outpatient Medications on File Prior to Visit  Medication Sig Dispense Refill   fluconazole  (DIFLUCAN ) 150 MG tablet Take 1 tablet PO once. Repeat in 3 days if needed. 2 tablet 0   No current facility-administered medications on file prior to visit.    Social History   Socioeconomic History   Marital status: Married    Spouse name: Not on file   Number of children: Not on file   Years of education: Not on file   Highest education level: Not on file  Occupational History   Not on file  Tobacco Use   Smoking status: Never    Passive exposure: Never   Smokeless tobacco: Never  Vaping Use   Vaping status: Never Used  Substance and Sexual Activity   Alcohol use: Yes  Comment: social   Drug use: Never   Sexual activity: Yes    Partners: Male    Birth control/protection: Surgical    Comment: hysterectomy  Other Topics Concern   Not on file  Social History Narrative   Not on file   Social Drivers of Health   Financial Resource Strain: Low Risk  (11/01/2023)   Overall Financial Resource Strain (CARDIA)    Difficulty of Paying Living Expenses: Not hard at all  Food Insecurity: No Food Insecurity (11/01/2023)   Hunger Vital Sign    Worried About Running Out of Food in the Last Year: Never true    Ran Out of Food in the Last Year: Never true  Transportation Needs: No Transportation Needs (11/01/2023)   PRAPARE - Administrator, Civil Service (Medical): No    Lack of Transportation (Non-Medical): No  Physical Activity: Insufficiently Active (11/01/2023)   Exercise Vital Sign    Days of Exercise per Week: 2 days    Minutes of Exercise per Session:  20 min  Stress: No Stress Concern Present (11/01/2023)   [employer] of Occupational Health - Occupational Stress Questionnaire    Feeling of Stress : Not at all  Social Connections: Moderately Isolated (11/01/2023)   Social Connection and Isolation Panel    Frequency of Communication with Friends and Family: More than three times a week    Frequency of Social Gatherings with Friends and Family: More than three times a week    Attends Religious Services: Never    Database administrator or Organizations: No    Attends Banker Meetings: Never    Marital Status: Married  Catering manager Violence: Not At Risk (11/01/2023)   Humiliation, Afraid, Rape, and Kick questionnaire    Fear of Current or Ex-Partner: No    Emotionally Abused: No    Physically Abused: No    Sexually Abused: No    Family History  Problem Relation Age of Onset   Diabetes Mother    Hypertension Mother    Rheum arthritis Mother    Diabetes Father    Hypertension Father    Heart murmur Father    Diabetes Sister    Hypertension Sister    Rheum arthritis Sister    Diabetes Brother    Hypertension Brother    Ovarian cancer Maternal Grandmother    Diabetes Maternal Grandmother    Hypertension Maternal Grandmother    Stroke Maternal Grandmother    Colon cancer Maternal Grandfather    Diabetes Maternal Grandfather    Hypertension Maternal Grandfather    Stroke Maternal Grandfather    Diabetes Paternal Grandmother    Hypertension Paternal Grandmother    Diabetes Paternal Grandfather    Hypertension Paternal Grandfather    Alzheimer's disease Paternal Grandfather    Brain cancer Maternal Uncle      No Known Allergies    Patient's last menstrual period was Patient's last menstrual period was 08/30/2017 (approximate)..            Review of Systems Alls systems reviewed and are negative.     Physical Exam HENT:     Head:     Comments: B/I thryoid enlargement      A:         Well  Woman GYN exam                             P:  Pap smear collected today Encouraged annual mammogram screening Colon cancer screening referral placed today DXA not indicated Labs and immunizations ordered today Discussed breast self exams Encouraged healthy lifestyle practices Encouraged Vit D and Calcium   Thyroid  PUS to evaluate enlarged thyroid  Patient desires STI testing Recurrent bv/yeast infection: nu swab and m.urea sent.  To begin phexxi  before intercourse for ph balance.  No follow-ups on file.  Michelle Holloway

## 2024-04-04 ENCOUNTER — Ambulatory Visit: Payer: Self-pay | Admitting: Obstetrics and Gynecology

## 2024-04-04 LAB — CBC
HCT: 42.9 % (ref 35.0–45.0)
Hemoglobin: 14 g/dL (ref 11.7–15.5)
MCH: 29.2 pg (ref 27.0–33.0)
MCHC: 32.6 g/dL (ref 32.0–36.0)
MCV: 89.4 fL (ref 80.0–100.0)
MPV: 10.1 fL (ref 7.5–12.5)
Platelets: 230 Thousand/uL (ref 140–400)
RBC: 4.8 Million/uL (ref 3.80–5.10)
RDW: 13.7 % (ref 11.0–15.0)
WBC: 4 Thousand/uL (ref 3.8–10.8)

## 2024-04-04 LAB — COMPREHENSIVE METABOLIC PANEL WITH GFR
AG Ratio: 1.6 (calc) (ref 1.0–2.5)
ALT: 10 U/L (ref 6–29)
AST: 15 U/L (ref 10–35)
Albumin: 4.1 g/dL (ref 3.6–5.1)
Alkaline phosphatase (APISO): 57 U/L (ref 31–125)
BUN: 9 mg/dL (ref 7–25)
CO2: 28 mmol/L (ref 20–32)
Calcium: 9.1 mg/dL (ref 8.6–10.2)
Chloride: 105 mmol/L (ref 98–110)
Creat: 0.92 mg/dL (ref 0.50–0.99)
Globulin: 2.6 g/dL (ref 1.9–3.7)
Glucose, Bld: 99 mg/dL (ref 65–99)
Potassium: 4.9 mmol/L (ref 3.5–5.3)
Sodium: 139 mmol/L (ref 135–146)
Total Bilirubin: 0.6 mg/dL (ref 0.2–1.2)
Total Protein: 6.7 g/dL (ref 6.1–8.1)
eGFR: 78 mL/min/1.73m2 (ref >=60)

## 2024-04-04 LAB — SURESWAB® ADVANCED VAGINITIS PLUS,TMA
C. trachomatis RNA, TMA: NOT DETECTED
CANDIDA SPECIES: NOT DETECTED
Candida glabrata: NOT DETECTED
N. gonorrhoeae RNA, TMA: NOT DETECTED
SURESWAB(R) ADV BACTERIAL VAGINOSIS(BV),TMA: POSITIVE — AB
TRICHOMONAS VAGINALIS (TV),TMA: NOT DETECTED

## 2024-04-04 LAB — LIPID PANEL
Cholesterol: 215 mg/dL — ABNORMAL HIGH (ref <200)
HDL: 82 mg/dL (ref >=50)
LDL Cholesterol (Calc): 112 mg/dL — ABNORMAL HIGH
Non-HDL Cholesterol (Calc): 133 mg/dL — ABNORMAL HIGH (ref <130)
Total CHOL/HDL Ratio: 2.6 (calc) (ref <5.0)
Triglycerides: 107 mg/dL (ref <150)

## 2024-04-04 LAB — HEMOGLOBIN A1C
Hgb A1c MFr Bld: 5.6 % (ref <5.7)
Mean Plasma Glucose: 114 mg/dL
eAG (mmol/L): 6.3 mmol/L

## 2024-04-04 LAB — HIV ANTIBODY (ROUTINE TESTING W REFLEX): HIV 1&2 Ab, 4th Generation: NONREACTIVE

## 2024-04-04 LAB — HEPATITIS C ANTIBODY: Hepatitis C Ab: NONREACTIVE

## 2024-04-04 LAB — VITAMIN D 25 HYDROXY (VIT D DEFICIENCY, FRACTURES): Vit D, 25-Hydroxy: 27 ng/mL — ABNORMAL LOW (ref 30–100)

## 2024-04-04 LAB — HEPATITIS B SURFACE ANTIGEN: Hepatitis B Surface Ag: NONREACTIVE

## 2024-04-04 LAB — T4, FREE: Free T4: 1.1 ng/dL (ref 0.8–1.8)

## 2024-04-04 LAB — RPR: RPR Ser Ql: NONREACTIVE

## 2024-04-04 LAB — TSH: TSH: 1.69 m[IU]/L

## 2024-04-05 LAB — CYTOLOGY - PAP
Comment: NEGATIVE
Diagnosis: NEGATIVE
High risk HPV: NEGATIVE

## 2024-04-08 ENCOUNTER — Other Ambulatory Visit: Payer: Self-pay

## 2024-04-08 LAB — SURESWAB MYCOPLASMA/UREAPLASMA, PCR
M. hominis DNA: NOT DETECTED
MYCOPLASMA GENITALIUM, rRNA,TMA: NOT DETECTED
U. parvum DNA: DETECTED — AB
U. urealyticum DNA: NOT DETECTED

## 2024-04-08 MED ORDER — DOXYCYCLINE HYCLATE 100 MG PO CAPS: 100.0000 mg | ORAL_CAPSULE | Freq: Two times a day (BID) | ORAL | 0 refills | Status: AC

## 2024-04-08 MED ORDER — METRONIDAZOLE 500 MG PO TABS: 500.0000 mg | ORAL_TABLET | Freq: Two times a day (BID) | ORAL | 0 refills | Status: AC

## 2024-04-09 ENCOUNTER — Ambulatory Visit (INDEPENDENT_AMBULATORY_CARE_PROVIDER_SITE_OTHER): Admitting: Dermatology

## 2024-04-09 ENCOUNTER — Encounter: Payer: Self-pay | Admitting: Dermatology

## 2024-04-09 VITALS — BP 126/81 | HR 60 | Temp 97.9°F

## 2024-04-09 DIAGNOSIS — D485 Neoplasm of uncertain behavior of skin: Secondary | ICD-10-CM

## 2024-04-09 DIAGNOSIS — D2122 Benign neoplasm of connective and other soft tissue of left lower limb, including hip: Secondary | ICD-10-CM | POA: Diagnosis not present

## 2024-04-09 DIAGNOSIS — D492 Neoplasm of unspecified behavior of bone, soft tissue, and skin: Secondary | ICD-10-CM

## 2024-04-09 NOTE — Patient Instructions (Signed)

## 2024-04-09 NOTE — Progress Notes (Signed)
 Follow-Up Visit   Subjective  Michelle Holloway is a 42 y.o. female who presents for the following: Excision of a neoplasm of skin of the left thigh, referred by Erminio Like, PA-C.  Patient states that the spot has been there for 5 years, but it has been painful for the last year. It has grown through the years. Negative for draining.   The following portions of the chart were reviewed this encounter and updated as appropriate: medications, allergies, medical history  Review of Systems:  No other skin or systemic complaints except as noted in HPI or Assessment and Plan.  Objective  Well appearing patient in no apparent distress; mood and affect are within normal limits.  A focused examination was performed of the following areas: Left thigh Relevant physical exam findings are noted in the Assessment and Plan.   Left Thigh - Anterior 6mm subcutaneous nodule   Assessment & Plan   NEOPLASM OF UNCERTAIN BEHAVIOR OF SKIN Left Thigh - Anterior Skin excision  Excision method:  elliptical Lesion length (cm):  1.3 Lesion width (cm):  1.7 Margin per side (cm):  0.1 Total excision diameter (cm):  1.9 Informed consent: discussed and consent obtained   Timeout: patient name, date of birth, surgical site, and procedure verified   Procedure prep:  Patient was prepped and draped in usual sterile fashion Prep type:  Chlorhexidine  Anesthesia: the lesion was anesthetized in a standard fashion   Anesthetic:  1% lidocaine  w/ epinephrine 1-100,000 buffered w/ 8.4% NaHCO3 Instrument used: #15 blade   Hemostasis achieved with: suture, pressure and electrodesiccation   Outcome: patient tolerated procedure well with no complications   Post-procedure details: sterile dressing applied and wound care instructions given   Dressing type: pressure dressing (Steri Strips)    Skin repair Complexity:  Complex Final length (cm):  3.5 Informed consent: discussed and consent obtained   Timeout:  patient name, date of birth, surgical site, and procedure verified   Procedure prep:  Patient was prepped and draped in usual sterile fashion Prep type:  Chlorhexidine  Anesthesia: the lesion was anesthetized in a standard fashion   Anesthetic:  1% lidocaine  w/ epinephrine 1-100,000 buffered w/ 8.4% NaHCO3 Reason for type of repair: reduce tension to allow closure, reduce subcutaneous dead space and avoid a hematoma, allow closure of the large defect and preserve normal anatomy   Undermining: area extensively undermined   Subcutaneous layers (deep stitches):  Suture size:  4-0 Suture type: Vicryl (polyglactin 910)   Stitches:  Buried vertical mattress Fine/surface layer approximation (top stitches):  Suture type: cyanoacrylate tissue glue   Hemostasis achieved with: electrodesiccation Outcome: patient tolerated procedure well with no complications   Post-procedure details: sterile dressing applied and wound care instructions given   Dressing type: pressure dressing and petrolatum (Steri Strips)    Specimen 1 - Surgical pathology Differential Diagnosis: R/O cyst vs lipoma vs other  Check Margins: No  The surgical wound was then cleaned, prepped, and re-anesthetized as above. Wound edges were undermined minimally along at least one entire edge and at a distance equal to or greater than the width of the defect (see wound defect size above) in order to achieve closure and decrease wound tension and anatomic distortion. Redundant tissue repair including standing cone removal was performed. Hemostasis was achieved with electrocautery. Subcutaneous and epidermal tissues were approximated with the above sutures. The surgical site was then lightly scrubbed with sterile, saline-soaked gauze. Steri-strips were applied, and the area was then bandaged using Vaseline ointment, non-adherent  gauze, gauze pads, and tape to provide an adequate pressure dressing. The patient tolerated the procedure well, was  given detailed written and verbal wound care instructions, and was discharged in good condition.   The patient will follow-up: PRN.    Return if symptoms worsen or fail to improve.  LILLETTE Rollene Gobble, RN, am acting as scribe for RUFUS CHRISTELLA HOLY, MD .   Documentation: I have reviewed the above documentation for accuracy and completeness, and I agree with the above.  RUFUS CHRISTELLA HOLY, MD

## 2024-04-10 LAB — SURGICAL PATHOLOGY

## 2024-04-12 ENCOUNTER — Ambulatory Visit (AMBULATORY_SURGERY_CENTER)

## 2024-04-12 ENCOUNTER — Ambulatory Visit: Payer: Self-pay | Admitting: Dermatology

## 2024-04-12 VITALS — Ht 65.0 in | Wt 195.0 lb

## 2024-04-12 DIAGNOSIS — Z1211 Encounter for screening for malignant neoplasm of colon: Secondary | ICD-10-CM

## 2024-04-12 MED ORDER — NA SULFATE-K SULFATE-MG SULF 17.5-3.13-1.6 GM/177ML PO SOLN: 1.0000 | Freq: Once | ORAL | 0 refills | Status: AC

## 2024-04-12 NOTE — Progress Notes (Signed)
 No issues known to pt with past sedation with any surgeries or procedures Patient denies ever being told they had issues or difficulty with intubation  No FH of Malignant Hyperthermia Pt is not on diet pills Pt is not on home 02  Pt is not on blood thinners  Pt denies issues with chronic constipation  No A fib or A flutter Have any cardiac testing pending--no Pt instructed to use [email or web address] or GoodRx for a price reduction on prep  Ambulates independently

## 2024-04-25 ENCOUNTER — Telehealth: Payer: Self-pay | Admitting: Gastroenterology

## 2024-04-25 ENCOUNTER — Ambulatory Visit (HOSPITAL_BASED_OUTPATIENT_CLINIC_OR_DEPARTMENT_OTHER)

## 2024-04-25 ENCOUNTER — Encounter: Admitting: Gastroenterology

## 2024-04-25 NOTE — Telephone Encounter (Signed)
 Goodmorning Dr.Cirigliano  Patient called needing to reschedule procedure for today 04/25/24 at 3pm, patient wasn't  able to do prep due to having an emergency. Patient rescheduled for 05/29/24. Please advise  Thank you

## 2024-05-01 ENCOUNTER — Ambulatory Visit: Admitting: Obstetrics and Gynecology

## 2024-05-01 ENCOUNTER — Telehealth: Payer: Self-pay

## 2024-05-01 NOTE — Telephone Encounter (Signed)
 Patient had to fly out this morning for a family emergency. She will call when she returns to reschedule her appointment.

## 2024-05-01 NOTE — Telephone Encounter (Signed)
-----   Message from Arland HERO sent at 05/01/2024  9:15 AM EDT ----- Regarding: TOC today appt canceled w/EB Patient Michelle Holloway at appts to cancel her TOC appt for today with EB. Reason was emergency

## 2024-05-29 ENCOUNTER — Encounter: Admitting: Gastroenterology

## 2024-06-03 ENCOUNTER — Encounter (HOSPITAL_BASED_OUTPATIENT_CLINIC_OR_DEPARTMENT_OTHER): Admitting: Family Medicine

## 2024-06-20 ENCOUNTER — Telehealth: Payer: Self-pay | Admitting: *Deleted

## 2024-06-20 DIAGNOSIS — N644 Mastodynia: Secondary | ICD-10-CM

## 2024-06-20 NOTE — Telephone Encounter (Signed)
 Call returned to patient. Patient states she was seen in office on 04/03/24, Dx MMG discussed for breast pain, waiting to schedule.   Per review of 04/03/24 AEX, needs Dx MMG due to having breast pain. Also noted annual screening MMG.   Bilateral Dx MMG ordered. Call placed to [facility] to schedule, spoke with Laketha. Scheduled for next available on 07/16/24 at 0840.   Screening MMG order cancelled.   Patient notified of appt, is agreeable to date and time. Our office will return call if any additional recommendations.   Routing to provider for final review. Patient is agreeable to disposition. Will close encounter.

## 2024-07-16 ENCOUNTER — Encounter

## 2024-07-16 ENCOUNTER — Other Ambulatory Visit

## 2024-08-01 ENCOUNTER — Encounter (HOSPITAL_BASED_OUTPATIENT_CLINIC_OR_DEPARTMENT_OTHER): Payer: Self-pay | Admitting: Family Medicine

## 2024-08-01 NOTE — Telephone Encounter (Signed)
 Please see mychart message sent by pt about letter she is needing. Did tell pt to contact our medical records dept to obtain needed records to go along with the letter.

## 2024-08-13 ENCOUNTER — Other Ambulatory Visit

## 2024-08-13 ENCOUNTER — Encounter

## 2024-11-04 ENCOUNTER — Encounter (HOSPITAL_BASED_OUTPATIENT_CLINIC_OR_DEPARTMENT_OTHER): Admitting: Family Medicine
